# Patient Record
Sex: Female | Born: 2000 | Race: Black or African American | Hispanic: No | Marital: Single | Smoking: Former smoker
Health system: Southern US, Community
[De-identification: ages and names within clinical notes are randomized; demographics above are authoritative.]

## PROBLEM LIST (undated history)

## (undated) DIAGNOSIS — Z8619 Personal history of other infectious and parasitic diseases: Secondary | ICD-10-CM

## (undated) DIAGNOSIS — R7303 Prediabetes: Secondary | ICD-10-CM

## (undated) DIAGNOSIS — R87619 Unspecified abnormal cytological findings in specimens from cervix uteri: Secondary | ICD-10-CM

## (undated) DIAGNOSIS — N939 Abnormal uterine and vaginal bleeding, unspecified: Secondary | ICD-10-CM

## (undated) DIAGNOSIS — N946 Dysmenorrhea, unspecified: Secondary | ICD-10-CM

## (undated) DIAGNOSIS — A64 Unspecified sexually transmitted disease: Secondary | ICD-10-CM

## (undated) DIAGNOSIS — F32A Depression, unspecified: Secondary | ICD-10-CM

## (undated) HISTORY — DX: Unspecified sexually transmitted disease: A64

## (undated) HISTORY — DX: Personal history of other infectious and parasitic diseases: Z86.19

## (undated) HISTORY — DX: Abnormal uterine and vaginal bleeding, unspecified: N93.9

## (undated) HISTORY — DX: Prediabetes: R73.03

## (undated) HISTORY — DX: Unspecified abnormal cytological findings in specimens from cervix uteri: R87.619

## (undated) HISTORY — DX: Depression, unspecified: F32.A

## (undated) HISTORY — DX: Dysmenorrhea, unspecified: N94.6

---
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
## 2018-04-03 ENCOUNTER — Other Ambulatory Visit: Payer: Self-pay | Admitting: Obstetrics and Gynecology

## 2018-04-03 NOTE — Telephone Encounter (Signed)
Patient left voicemail over lunch requesting a refill request on Junel Fe to be sent to [facility] on [address].

## 2018-04-03 NOTE — Telephone Encounter (Signed)
Pharmacy updated.   Patient last seen in office on 09/26/17  Routing to covering provider to review refill request.

## 2018-04-03 NOTE — Telephone Encounter (Signed)
Medication refill request: Junel fe Last AEX: 05/17/17  Next AEX: noting scheduled at this time called patient left message to schedule aex.  Last MMG (if hormonal medication request): NA Refill authorized: #[id number] with 0 rf

## 2018-04-03 NOTE — Telephone Encounter (Signed)
Patient called in and scheduled her AEX for 06/25/18 at 10:15am with Dr. Oscar La.  Her [facility] is: [facility] [address]

## 2018-04-04 MED ORDER — NORETHIN ACE-ETH ESTRAD-FE 1-20 MG-MCG PO TABS: 1.0000 | ORAL_TABLET | Freq: Every day | ORAL | 0 refills | Status: DC

## 2018-04-04 NOTE — Telephone Encounter (Signed)
Call to patient to notify of RX, no answer.   Call to home number, spoke with patients mom, no info provided, advised to return call Noreene Larsson at [facility].

## 2018-06-25 ENCOUNTER — Ambulatory Visit: Payer: Federal, State, Local not specified - PPO | Admitting: Obstetrics and Gynecology

## 2018-09-18 ENCOUNTER — Telehealth: Payer: Self-pay | Admitting: Obstetrics and Gynecology

## 2018-09-18 NOTE — Telephone Encounter (Signed)
Patient has a lump on right breast.

## 2018-09-18 NOTE — Telephone Encounter (Signed)
Spoke with patient. She has AEX 10-06-18 with Dr.Jertson. She is complaining of Rt.Br.lump since 06/2018. It started small but last month got larger. This month has decreased in size some. Offered patient appointment for next week and she declined. States she is living in [place] now and will only be in town the week of her AEX. Advised patient not sure what else I can offer. I did advise her to seek evaluation at an Urgent care in [place] since this lump has been present since 06/2018. She thanked me for calling.  Routed to provider to sign and close.

## 2018-10-02 NOTE — Progress Notes (Signed)
18 y.o. G0P0000 Single Black or African American Not Hispanic or Latino female here for annual exam. She was started on OCP's last year, she stopped because it was difficult to get. Her cycles did get better on the pill, made the cramps tolerable. She ran out of pills in January, 20.  She isn't currently sexually active, last active 09/06/18. She wants to start depo provera. She treated herself for yeast on 08/26/18, she has had a brown thick vaginal discharge.   She c/o a lump in her right breast, noticed it in 3/20, initially tender not now. When she is on her cycle it grows, gets smaller on her cycle  Period Cycle (Days): 28 Period Duration (Days): 5-6 days Period Pattern: Regular Menstrual Flow: Light, Heavy Menstrual Control: Thin pad Menstrual Control Change Freq (Hours): changes pad every 2-3 hours on heavy days Dysmenorrhea: (!) Severe Dysmenorrhea Symptoms: Cramping  Patient's last menstrual period was 10/01/2018 (exact date).          Sexually active: No.  The current method of family planning is abstinence.    Exercising: No.  The patient does not participate in regular exercise at present. Smoker:  no  Health Maintenance: Pap:  N/A TDaP:  Up to date  Gardasil: no    reports that she has never smoked. She has never used smokeless tobacco. She reports that she does not drink alcohol or use drugs. Just finished her first year of college, will study social work.   Past Medical History:  Diagnosis Date  . Abnormal uterine bleeding   . Dysmenorrhea   . History of chlamydia   . Prediabetes     History reviewed. No pertinent surgical history.  No current outpatient medications on file.   No current facility-administered medications for this visit.     Family History  Problem Relation Age of Onset  . Hypertension Mother   . Diabetes Mother     Review of Systems  Constitutional:       Right breast mass  HENT: Negative.   Eyes: Negative.   Respiratory: Negative.    Cardiovascular: Negative.   Gastrointestinal: Negative.   Endocrine: Negative.   Genitourinary: Positive for menstrual problem.  Musculoskeletal: Negative.   Skin: Negative.   Allergic/Immunologic: Negative.   Neurological: Negative.   Hematological: Negative.   Psychiatric/Behavioral: Negative.     Exam:   BP 112/68 (BP Location: Right Arm, Patient Position: Sitting, Cuff Size: Normal)   Pulse 60   Temp (!) 97.3 F (36.3 C) (Skin)   Ht 5\' 4"  (1.626 m)   Wt 116 lb (52.6 kg)   LMP 10/01/2018 (Exact Date)   BMI 19.91 kg/m   Weight change: @WEIGHTCHANGE @ Height:   Height: 5\' 4"  (162.6 cm)  Ht Readings from Last 3 Encounters:  10/06/18 5\' 4"  (1.626 m) (45 %, Z= -0.12)*  09/09/17 5\' 6"  (1.676 m) (75 %, Z= 0.69)*   * Growth percentiles are based on CDC (Girls, 2-20 Years) data.    General appearance: alert, cooperative and appears stated age Head: Normocephalic, without obvious abnormality, atraumatic Neck: no adenopathy, supple, symmetrical, trachea midline and thyroid normal to inspection and palpation Lungs: clear to auscultation bilaterally Cardiovascular: regular rate and rhythm Breasts: in the right breast at 2 o'clock she has a 3+ x 1.5 cm, smooth, mobile lump. No other lumps, no skin changes.  Abdomen: soft, non-tender; non distended,  no masses,  no organomegaly Extremities: extremities normal, atraumatic, no cyanosis or edema Skin: Skin color, texture, turgor normal.  No rashes or lesions Lymph nodes: Cervical, supraclavicular, and axillary nodes normal. No abnormal inguinal nodes palpated Neurologic: Grossly normal   Pelvic: External genitalia:  no lesions              Urethra:  normal appearing urethra with no masses, tenderness or lesions              Bartholins and Skenes: normal                 Vagina: normal appearing vagina with normal color a slight increase in thick, white vaginal discharge              Cervix: no lesions               Bimanual Exam:   Uterus:  normal size, contour, position, consistency, mobility, non-tender              Adnexa: no mass, fullness, tenderness               Rectovaginal: Confirms               Anus:  normal sphincter tone, no lesions  Chaperone was present for exam.  A:  Well Woman with normal exam  Right breast lump, 2 o'clock  Contraception counseling  Vaginal discharge  Intermenstrual spotting      P:   No pap until 19  STD testing (wants full testing, including HSV)  Will set up right breast ultrasound  Affirm  Discussed breast self exam  Discussed calcium and vit D intake  UPT negative  Return on her cycle for depo provera 150 mg IM q 3 months x 1 year

## 2018-10-06 ENCOUNTER — Telehealth: Payer: Self-pay | Admitting: *Deleted

## 2018-10-06 ENCOUNTER — Ambulatory Visit (INDEPENDENT_AMBULATORY_CARE_PROVIDER_SITE_OTHER): Payer: Federal, State, Local not specified - PPO | Admitting: Obstetrics and Gynecology

## 2018-10-06 ENCOUNTER — Encounter: Payer: Self-pay | Admitting: Obstetrics and Gynecology

## 2018-10-06 ENCOUNTER — Other Ambulatory Visit: Payer: Self-pay

## 2018-10-06 VITALS — BP 112/68 | HR 60 | Temp 97.3°F | Ht 64.0 in | Wt 116.0 lb

## 2018-10-06 DIAGNOSIS — N6312 Unspecified lump in the right breast, upper inner quadrant: Secondary | ICD-10-CM | POA: Diagnosis not present

## 2018-10-06 DIAGNOSIS — Z8619 Personal history of other infectious and parasitic diseases: Secondary | ICD-10-CM | POA: Insufficient documentation

## 2018-10-06 DIAGNOSIS — Z113 Encounter for screening for infections with a predominantly sexual mode of transmission: Secondary | ICD-10-CM | POA: Diagnosis not present

## 2018-10-06 DIAGNOSIS — R7303 Prediabetes: Secondary | ICD-10-CM

## 2018-10-06 DIAGNOSIS — Z0000 Encounter for general adult medical examination without abnormal findings: Secondary | ICD-10-CM | POA: Diagnosis not present

## 2018-10-06 DIAGNOSIS — N898 Other specified noninflammatory disorders of vagina: Secondary | ICD-10-CM

## 2018-10-06 DIAGNOSIS — Z3009 Encounter for other general counseling and advice on contraception: Secondary | ICD-10-CM

## 2018-10-06 DIAGNOSIS — Z01419 Encounter for gynecological examination (general) (routine) without abnormal findings: Secondary | ICD-10-CM

## 2018-10-06 DIAGNOSIS — N923 Ovulation bleeding: Secondary | ICD-10-CM | POA: Diagnosis not present

## 2018-10-06 LAB — POCT URINE PREGNANCY: Preg Test, Ur: NEGATIVE

## 2018-10-06 NOTE — Patient Instructions (Signed)
EXERCISE AND DIET:  We recommended that you start or continue a regular exercise program for good health. Regular exercise means any activity that makes your heart beat faster and makes you sweat.  We recommend exercising at least 30 minutes per day at least 3 days a week, preferably 4 or 5.  We also recommend a diet low in fat and sugar.  Inactivity, poor dietary choices and obesity can cause diabetes, heart attack, stroke, and kidney damage, among others.    ALCOHOL AND SMOKING:  Women should limit their alcohol intake to no more than 7 drinks/beers/glasses of wine (combined, not each!) per week. Moderation of alcohol intake to this level decreases your risk of breast cancer and liver damage. And of course, no recreational drugs are part of a healthy lifestyle.  And absolutely no smoking or even second hand smoke. Most people know smoking can cause heart and lung diseases, but did you know it also contributes to weakening of your bones? Aging of your skin?  Yellowing of your teeth and nails?  CALCIUM AND VITAMIN D:  Adequate intake of calcium and Vitamin D are recommended.  The recommendations for exact amounts of these supplements seem to change often, but generally speaking 1,000 mg of calcium (between diet and supplement) and 800 units of Vitamin D per day seems prudent. Certain women may benefit from higher intake of Vitamin D.  If you are among these women, your doctor will have told you during your visit.    PAP SMEARS:  Pap smears, to check for cervical cancer or precancers,  have traditionally been done yearly, although recent scientific advances have shown that most women can have pap smears less often.  However, every woman still should have a physical exam from her gynecologist every year. It will include a breast check, inspection of the vulva and vagina to check for abnormal growths or skin changes, a visual exam of the cervix, and then an exam to evaluate the size and shape of the uterus and  ovaries.  And after 21 years of age, a rectal exam is indicated to check for rectal cancers. We will also provide age appropriate advice regarding health maintenance, like when you should have certain vaccines, screening for sexually transmitted diseases, bone density testing, colonoscopy, mammograms, etc.   MAMMOGRAMS:  All women over 75 years old should have a yearly mammogram. Many facilities now offer a "3D" mammogram, which may cost around $50 extra out of pocket. If possible,  we recommend you accept the option to have the 3D mammogram performed.  It both reduces the number of women who will be called back for extra views which then turn out to be normal, and it is better than the routine mammogram at detecting truly abnormal areas.    COLON CANCER SCREENING: Now recommend starting at age 57. At this time colonoscopy is not covered for routine screening until 50. There are take home tests that can be done between 45-49.   COLONOSCOPY:  Colonoscopy to screen for colon cancer is recommended for all women at age 91.  We know, you hate the idea of the prep.  We agree, BUT, having colon cancer and not knowing it is worse!!  Colon cancer so often starts as a polyp that can be seen and removed at colonscopy, which can quite literally save your life!  And if your first colonoscopy is normal and you have no family history of colon cancer, most women don't have to have it again for  10 years.  Once every ten years, you can do something that may end up saving your life, right?  We will be happy to help you get it scheduled when you are ready.  Be sure to check your insurance coverage so you understand how much it will cost.  It may be covered as a preventative service at no cost, but you should check your particular policy.   ° ° ° °Breast Self-Awareness °Breast self-awareness means being familiar with how your breasts look and feel. It involves checking your breasts regularly and reporting any changes to your  health care provider. °Practicing breast self-awareness is important. A change in your breasts can be a sign of a serious medical problem. Being familiar with how your breasts look and feel allows you to find any problems early, when treatment is more likely to be successful. All women should practice breast self-awareness, including women who have had breast implants. °How to do a breast self-exam °One way to learn what is normal for your breasts and whether your breasts are changing is to do a breast self-exam. To do a breast self-exam: °Look for Changes ° °1. Remove all the clothing above your waist. °2. Stand in front of a mirror in a room with good lighting. °3. Put your hands on your hips. °4. Push your hands firmly downward. °5. Compare your breasts in the mirror. Look for differences between them (asymmetry), such as: °? Differences in shape. °? Differences in size. °? Puckers, dips, and bumps in one breast and not the other. °6. Look at each breast for changes in your skin, such as: °? Redness. °? Scaly areas. °7. Look for changes in your nipples, such as: °? Discharge. °? Bleeding. °? Dimpling. °? Redness. °? A change in position. °Feel for Changes °Carefully feel your breasts for lumps and changes. It is best to do this while lying on your back on the floor and again while sitting or standing in the shower or tub with soapy water on your skin. Feel each breast in the following way: °· Place the arm on the side of the breast you are examining above your head. °· Feel your breast with the other hand. °· Start in the nipple area and make ¾ inch (2 cm) overlapping circles to feel your breast. Use the pads of your three middle fingers to do this. Apply light pressure, then medium pressure, then firm pressure. The light pressure will allow you to feel the tissue closest to the skin. The medium pressure will allow you to feel the tissue that is a little deeper. The firm pressure will allow you to feel the tissue  close to the ribs. °· Continue the overlapping circles, moving downward over the breast until you feel your ribs below your breast. °· Move one finger-width toward the center of the body. Continue to use the ¾ inch (2 cm) overlapping circles to feel your breast as you move slowly up toward your collarbone. °· Continue the up and down exam using all three pressures until you reach your armpit. ° °Write Down What You Find ° °Write down what is normal for each breast and any changes that you find. Keep a written record with breast changes or normal findings for each breast. By writing this information down, you do not need to depend only on memory for size, tenderness, or location. Write down where you are in your menstrual cycle, if you are still menstruating. °If you are having trouble noticing differences   in your breasts, do not get discouraged. With time you will become more familiar with the variations in your breasts and more comfortable with the exam. °How often should I examine my breasts? °Examine your breasts every month. If you are breastfeeding, the best time to examine your breasts is after a feeding or after using a breast pump. If you menstruate, the best time to examine your breasts is 5-7 days after your period is over. During your period, your breasts are lumpier, and it may be more difficult to notice changes. °When should I see my health care provider? °See your health care provider if you notice: °· A change in shape or size of your breasts or nipples. °· A change in the skin of your breast or nipples, such as a reddened or scaly area. °· Unusual discharge from your nipples. °· A lump or thick area that was not there before. °· Pain in your breasts. °· Anything that concerns you. ° °Medroxyprogesterone injection [Contraceptive] °What is this medicine? °MEDROXYPROGESTERONE (me DROX ee proe JES te rone) contraceptive injections prevent pregnancy. They provide effective birth control for 3 months.  Depo-subQ Provera 104 is also used for treating pain related to endometriosis. °This medicine may be used for other purposes; ask your health care provider or pharmacist if you have questions. °COMMON BRAND NAME(S): Depo-Provera, Depo-subQ Provera 104 °What should I tell my health care provider before I take this medicine? °They need to know if you have any of these conditions: °· frequently drink alcohol °· asthma °· blood vessel disease or a history of a blood clot in the lungs or legs °· bone disease such as osteoporosis °· breast cancer °· diabetes °· eating disorder (anorexia nervosa or bulimia) °· high blood pressure °· HIV infection or AIDS °· kidney disease °· liver disease °· mental depression °· migraine °· seizures (convulsions) °· stroke °· tobacco smoker °· vaginal bleeding °· an unusual or allergic reaction to medroxyprogesterone, other hormones, medicines, foods, dyes, or preservatives °· pregnant or trying to get pregnant °· breast-feeding °How should I use this medicine? °Depo-Provera Contraceptive injection is given into a muscle. Depo-subQ Provera 104 injection is given under the skin. These injections are given by a health care professional. You must not be pregnant before getting an injection. The injection is usually given during the first 5 days after the start of a menstrual period or 6 weeks after delivery of a baby. °Talk to your pediatrician regarding the use of this medicine in children. Special care may be needed. These injections have been used in female children who have started having menstrual periods. °Overdosage: If you think you have taken too much of this medicine contact a poison control center or emergency room at once. °NOTE: This medicine is only for you. Do not share this medicine with others. °What if I miss a dose? °Try not to miss a dose. You must get an injection once every 3 months to maintain birth control. If you cannot keep an appointment, call and reschedule it. If  you wait longer than 13 weeks between Depo-Provera contraceptive injections or longer than 14 weeks between Depo-subQ Provera 104 injections, you could get pregnant. Use another method for birth control if you miss your appointment. You may also need a pregnancy test before receiving another injection. °What may interact with this medicine? °Do not take this medicine with any of the following medications: °· bosentan °This medicine may also interact with the following medications: °· aminoglutethimide °· antibiotics   medicines for infections, especially rifampin, rifabutin, rifapentine, and griseofulvin  aprepitant  barbiturate medicines such as phenobarbital or primidone  bexarotene  carbamazepine  medicines for seizures like ethotoin, felbamate, oxcarbazepine, phenytoin, topiramate  modafinil  St. John's wort This list may not describe all possible interactions. Give your health care provider a list of all the medicines, herbs, non-prescription drugs, or dietary supplements you use. Also tell them if you smoke, drink alcohol, or use illegal drugs. Some items may interact with your medicine. What should I watch for while using this medicine? This drug does not protect you against HIV infection (AIDS) or other sexually transmitted diseases. Use of this product may cause you to lose calcium from your bones. Loss of calcium may cause weak bones (osteoporosis). Only use this product for more than 2 years if other forms of birth control are not right for you. The longer you use this product for birth control the more likely you will be at risk for weak bones. Ask your health care professional how you can keep strong bones. You may have a change in bleeding pattern or irregular periods. Many females stop having periods while taking this drug. If you have received your injections on time, your chance of being pregnant is very low. If you think you may be pregnant, see your health care professional as  soon as possible. Tell your health care professional if you want to get pregnant within the next year. The effect of this medicine may last a long time after you get your last injection. What side effects may I notice from receiving this medicine? Side effects that you should report to your doctor or health care professional as soon as possible:  allergic reactions like skin rash, itching or hives, swelling of the face, lips, or tongue  breast tenderness or discharge  breathing problems  changes in vision  depression  feeling faint or lightheaded, falls  fever  pain in the abdomen, chest, groin, or leg  problems with balance, talking, walking  unusually weak or tired  yellowing of the eyes or skin Side effects that usually do not require medical attention (report to your doctor or health care professional if they continue or are bothersome):  acne  fluid retention and swelling  headache  irregular periods, spotting, or absent periods  temporary pain, itching, or skin reaction at site where injected  weight gain This list may not describe all possible side effects. Call your doctor for medical advice about side effects. You may report side effects to FDA at 1-800-FDA-1088. Where should I keep my medicine? This does not apply. The injection will be given to you by a health care professional. NOTE: This sheet is a summary. It may not cover all possible information. If you have questions about this medicine, talk to your doctor, pharmacist, or health care provider.  2020 Elsevier/Gold Standard (2008-03-26 18:37:56)

## 2018-10-06 NOTE — Telephone Encounter (Signed)
Spoke [place] at [facility]. Right breast US scheduled for 10/07/18 at 10:10 am, arrive at 9:50am.

## 2018-10-06 NOTE — Telephone Encounter (Signed)
-----   Message from Salvadore Dom, MD sent at 10/06/2018 11:06 AM EDT ----- Please set this patient up for a right breast ultrasound. Marisa Snyder has a lump at 2 o'clock.  Thanks, Sharee Pimple

## 2018-10-06 NOTE — Telephone Encounter (Signed)
Spoke with patient, advised of appt at [facility], provided contact information. Patient verbalizes understanding and is agreeable to date and time.   Routing to provider for final review. Patient is agreeable to disposition. Will close encounter.

## 2018-10-07 ENCOUNTER — Ambulatory Visit
Admission: RE | Admit: 2018-10-07 | Discharge: 2018-10-07 | Disposition: A | Discharge status: Home / Self Care | Payer: Federal, State, Local not specified - PPO | Source: Ambulatory Visit | Attending: Obstetrics and Gynecology | Admitting: Obstetrics and Gynecology

## 2018-10-07 ENCOUNTER — Other Ambulatory Visit: Payer: Self-pay | Admitting: Obstetrics and Gynecology

## 2018-10-07 DIAGNOSIS — N6312 Unspecified lump in the right breast, upper inner quadrant: Secondary | ICD-10-CM

## 2018-10-07 LAB — VAGINITIS/VAGINOSIS, DNA PROBE
Candida Species: NEGATIVE
Gardnerella vaginalis: NEGATIVE
Trichomonas vaginosis: NEGATIVE

## 2018-10-08 LAB — COMPREHENSIVE METABOLIC PANEL
ALT: 12 IU/L (ref 0–32)
AST: 21 IU/L (ref 0–40)
Albumin/Globulin Ratio: 1.4 (ref 1.2–2.2)
Albumin: 4.2 g/dL (ref 3.9–5.0)
Alkaline Phosphatase: 47 IU/L (ref 39–117)
BUN/Creatinine Ratio: 13 (ref 9–23)
BUN: 9 mg/dL (ref 6–20)
Bilirubin Total: 0.3 mg/dL (ref 0.0–1.2)
CO2: 21 mmol/L (ref 20–29)
Calcium: 9.4 mg/dL (ref 8.7–10.2)
Chloride: 101 mmol/L (ref 96–106)
Creatinine, Ser: 0.71 mg/dL (ref 0.57–1.00)
GFR calc Af Amer: 143 mL/min/{1.73_m2} (ref >59)
GFR calc non Af Amer: 124 mL/min/{1.73_m2} (ref >59)
Globulin, Total: 3 g/dL (ref 1.5–4.5)
Glucose: 79 mg/dL (ref 65–99)
Potassium: 3.9 mmol/L (ref 3.5–5.2)
Sodium: 138 mmol/L (ref 134–144)
Total Protein: 7.2 g/dL (ref 6.0–8.5)

## 2018-10-08 LAB — LIPID PANEL
Chol/HDL Ratio: 2.1 ratio (ref 0.0–4.4)
Cholesterol, Total: 167 mg/dL (ref 100–169)
HDL: 78 mg/dL (ref >39)
LDL Calculated: 82 mg/dL (ref 0–109)
Triglycerides: 33 mg/dL (ref 0–89)
VLDL Cholesterol Cal: 7 mg/dL (ref 5–40)

## 2018-10-08 LAB — CBC
Hematocrit: 37.1 % (ref 34.0–46.6)
Hemoglobin: 11.2 g/dL (ref 11.1–15.9)
MCH: 25.2 pg — ABNORMAL LOW (ref 26.6–33.0)
MCHC: 30.2 g/dL — ABNORMAL LOW (ref 31.5–35.7)
MCV: 83 fL (ref 79–97)
Platelets: 326 10*3/uL (ref 150–450)
RBC: 4.45 x10E6/uL (ref 3.77–5.28)
RDW: 14.1 % (ref 11.7–15.4)
WBC: 5.7 10*3/uL (ref 3.4–10.8)

## 2018-10-08 LAB — HEP, RPR, HIV PANEL
HIV Screen 4th Generation wRfx: NONREACTIVE
Hepatitis B Surface Ag: NEGATIVE
RPR Ser Ql: NONREACTIVE

## 2018-10-08 LAB — HEPATITIS C ANTIBODY: Hep C Virus Ab: 0.2 s/co ratio (ref 0.0–0.9)

## 2018-10-08 LAB — HEMOGLOBIN A1C
Est. average glucose Bld gHb Est-mCnc: 117 mg/dL
Hgb A1c MFr Bld: 5.7 % — ABNORMAL HIGH (ref 4.8–5.6)

## 2018-10-08 LAB — HSV 2 ANTIBODY, IGG: HSV 2 IgG, Type Spec: <0.91 index (ref 0.00–0.90)

## 2018-10-09 LAB — GC/CHLAMYDIA PROBE AMP
Chlamydia trachomatis, NAA: NEGATIVE
Neisseria Gonorrhoeae by PCR: NEGATIVE

## 2018-10-24 ENCOUNTER — Telehealth: Payer: Self-pay | Admitting: Obstetrics and Gynecology

## 2018-10-24 NOTE — Telephone Encounter (Signed)
Spoke with patient. LMP 10/22/18. Nurse visit scheduled for 8/10 at 9:45am. Covid19 prescreen negative, precautions reviewed.   Routing to provider for final review. Patient is agreeable to disposition. Will close encounter.

## 2018-10-24 NOTE — Telephone Encounter (Signed)
Patient returning call.

## 2018-10-24 NOTE — Telephone Encounter (Signed)
Patient is calling regarding starting her cycle on Wednesday (10/22/2018). Patient was told to call when she started her cycle to set up an appointment to start Depo.

## 2018-10-24 NOTE — Telephone Encounter (Signed)
Call returned to patient, no answer, voicemail not set up.    Reviewed 10/06/18 AEX notes per Dr. Talbert Nan: Return on her cycle for depo provera 150 mg IM q 3 months x 1 year  Ok to proceed with scheduling days 1-7 of cycle.

## 2018-10-27 ENCOUNTER — Ambulatory Visit (INDEPENDENT_AMBULATORY_CARE_PROVIDER_SITE_OTHER): Payer: Federal, State, Local not specified - PPO

## 2018-10-27 ENCOUNTER — Other Ambulatory Visit: Payer: Self-pay

## 2018-10-27 VITALS — BP 108/70 | HR 72 | Temp 97.8°F | Resp 14 | Ht 64.0 in | Wt 114.0 lb

## 2018-10-27 DIAGNOSIS — Z3042 Encounter for surveillance of injectable contraceptive: Secondary | ICD-10-CM

## 2018-10-27 MED ORDER — MEDROXYPROGESTERONE ACETATE 150 MG/ML IM SUSP
150.0000 mg | Freq: Once | INTRAMUSCULAR | Status: AC
  Administered 2018-10-27: 11:00:00 150 mg via INTRAMUSCULAR

## 2018-10-27 NOTE — Progress Notes (Signed)
Patient is here for Depo Provera Injection Patient is within Depo Provera Calender Limits first depo Next Depo Due between: 10/26-11/9 Last AEX: 10/06/18 AEX Scheduled: 10/08/19  Patient is aware when next depo is due  Pt tolerated Injection well in [place].  Routed to provider for review, encounter closed.

## 2018-11-10 ENCOUNTER — Telehealth: Payer: Self-pay | Admitting: Obstetrics and Gynecology

## 2018-11-10 NOTE — Telephone Encounter (Signed)
Call to patient. Patient states she has a yellowish-green discharge that started a couple of days ago. Patient states it has a fishy/sour smell to it. Denies fever or pelvic pain. States she is not currently sexually active and is currently on depo provera. LMP was 10-22-2018. OV recommended. Patient scheduled for 11-11-2018 at 1415. Patient agreeable to date and time of appointment.   Routing to provider and will close encounter.

## 2018-11-10 NOTE — Telephone Encounter (Signed)
Appointment Request From: Marisa Snyder    With Provider: Salvadore Dom, MD [facility]]    Preferred Date Range: Any    Preferred Times: Monday Morning, Tuesday Morning, Wednesday Morning, Thursday Morning, Friday Morning    Reason for visit: Request an Appointment    Comments:  I have a yellow-green discharge that has an odor to it coming from vaginal area.

## 2018-11-10 NOTE — Progress Notes (Deleted)
GYNECOLOGY  VISIT   HPI: 18 y.o.   Single Black or African American Not Hispanic or Latino  female   G0P0000 with Patient's last menstrual period was 10/22/2018.   here for     GYNECOLOGIC HISTORY: Patient's last menstrual period was 10/22/2018. Contraception:*** Menopausal hormone therapy: ***        OB History    Gravida  0   Para  0   Term  0   Preterm  0   AB  0   Living  0     SAB  0   TAB  0   Ectopic  0   Multiple  0   Live Births  0              Patient Active Problem List   Diagnosis Date Noted  . History of chlamydia   . Prediabetes     Past Medical History:  Diagnosis Date  . Abnormal uterine bleeding   . Dysmenorrhea   . History of chlamydia   . Prediabetes     No past surgical history on file.  No current outpatient medications on file.   No current facility-administered medications for this visit.      ALLERGIES: Patient has no known allergies.  Family History  Problem Relation Age of Onset  . Hypertension Mother   . Diabetes Mother     Social History   Socioeconomic History  . Marital status: Single    Spouse name: Not on file  . Number of children: Not on file  . Years of education: Not on file  . Highest education level: Not on file  Occupational History  . Not on file  Social Needs  . Financial resource strain: Not on file  . Food insecurity    Worry: Not on file    Inability: Not on file  . Transportation needs    Medical: Not on file    Non-medical: Not on file  Tobacco Use  . Smoking status: Never Smoker  . Smokeless tobacco: Never Used  Substance and Sexual Activity  . Alcohol use: No    Frequency: Never  . Drug use: No  . Sexual activity: Not Currently    Partners: Male  Lifestyle  . Physical activity    Days per week: Not on file    Minutes per session: Not on file  . Stress: Not on file  Relationships  . Social Musicianconnections    Talks on phone: Not on file    Gets together: Not on file   Attends religious service: Not on file    Active member of club or organization: Not on file    Attends meetings of clubs or organizations: Not on file    Relationship status: Not on file  . Intimate partner violence    Fear of current or ex partner: Not on file    Emotionally abused: Not on file    Physically abused: Not on file    Forced sexual activity: Not on file  Other Topics Concern  . Not on file  Social History Narrative  . Not on file    ROS  PHYSICAL EXAMINATION:    LMP 10/22/2018     General appearance: alert, cooperative and appears stated age Neck: no adenopathy, supple, symmetrical, trachea midline and thyroid {CHL AMB PHY EX THYROID NORM [phone]::"normal to inspection and palpation"} Breasts: {Exam; breast:13139::"normal appearance, no masses or tenderness"} Abdomen: soft, non-tender; non distended, no masses,  no organomegaly  Pelvic:  External genitalia:  no lesions              Urethra:  normal appearing urethra with no masses, tenderness or lesions              Bartholins and Skenes: normal                 Vagina: normal appearing vagina with normal color and discharge, no lesions              Cervix: {CHL AMB PHY EX CERVIX NORM DEFAULT:[phone]::"no lesions"}              Bimanual Exam:  Uterus:  {CHL AMB PHY EX UTERUS NORM DEFAULT:[phone]::"normal size, contour, position, consistency, mobility, non-tender"}              Adnexa: {CHL AMB PHY EX ADNEXA NO MASS DEFAULT:[phone]::"no mass, fullness, tenderness"}              Rectovaginal: {yes no:314532}.  Confirms.              Anus:  normal sphincter tone, no lesions  Chaperone was present for exam.  ASSESSMENT     PLAN    An After Visit Summary was printed and given to the patient.  *** minutes face to face time of which over 50% was spent in counseling.

## 2018-11-11 ENCOUNTER — Telehealth: Payer: Self-pay | Admitting: Obstetrics and Gynecology

## 2018-11-11 ENCOUNTER — Ambulatory Visit: Payer: Federal, State, Local not specified - PPO | Admitting: Obstetrics and Gynecology

## 2018-11-11 NOTE — Telephone Encounter (Signed)
Spoke with patient. Requesting to reschedule OV for vaginal odor and d/c. OV rescheduled to 8/27 at 1:30pm with Dr. Talbert Nan.   Routing to provider for final review. Patient is agreeable to disposition. Will close encounter.

## 2018-11-11 NOTE — Telephone Encounter (Signed)
Patient would like to reschedule her appointment originally scheduled for today 11/11/18. Nothing available for the next few days.

## 2018-11-13 ENCOUNTER — Encounter: Payer: Self-pay | Admitting: Obstetrics and Gynecology

## 2018-11-13 ENCOUNTER — Other Ambulatory Visit: Payer: Self-pay

## 2018-11-13 ENCOUNTER — Ambulatory Visit (INDEPENDENT_AMBULATORY_CARE_PROVIDER_SITE_OTHER): Payer: Federal, State, Local not specified - PPO | Admitting: Obstetrics and Gynecology

## 2018-11-13 VITALS — BP 100/58 | HR 84 | Temp 97.2°F | Resp 12 | Wt 116.0 lb

## 2018-11-13 DIAGNOSIS — N760 Acute vaginitis: Secondary | ICD-10-CM | POA: Diagnosis not present

## 2018-11-13 DIAGNOSIS — N898 Other specified noninflammatory disorders of vagina: Secondary | ICD-10-CM

## 2018-11-13 MED ORDER — METRONIDAZOLE 500 MG PO TABS: 500.0000 mg | ORAL_TABLET | Freq: Two times a day (BID) | ORAL | 0 refills | Status: DC

## 2018-11-13 NOTE — Patient Instructions (Signed)
Vaginitis Vaginitis is a condition in which the vaginal tissue swells and becomes red (inflamed). This condition is most often caused by a change in the normal balance of bacteria and yeast that live in the vagina. This change causes an overgrowth of certain bacteria or yeast, which causes the inflammation. There are different types of vaginitis, but the most common types are:  Bacterial vaginosis.  Yeast infection (candidiasis).  Trichomoniasis vaginitis. This is a sexually transmitted disease (STD).  Viral vaginitis.  Atrophic vaginitis.  Allergic vaginitis. What are the causes? The cause of this condition depends on the type of vaginitis. It can be caused by:  Bacteria (bacterial vaginosis).  Yeast, which is a fungus (yeast infection).  A parasite (trichomoniasis vaginitis).  A virus (viral vaginitis).  Low hormone levels (atrophic vaginitis). Low hormone levels can occur during pregnancy, breastfeeding, or after menopause.  Irritants, such as bubble baths, scented tampons, and feminine sprays (allergic vaginitis). Other factors can change the normal balance of the yeast and bacteria that live in the vagina. These include:  Antibiotic medicines.  Poor hygiene.  Diaphragms, vaginal sponges, spermicides, birth control pills, and intrauterine devices (IUD).  Sex.  Infection.  Uncontrolled diabetes.  A weakened defense (immune) system. What increases the risk? This condition is more likely to develop in women who:  Smoke.  Use vaginal douches, scented tampons, or scented sanitary pads.  Wear tight-fitting pants.  Wear thong underwear.  Use oral birth control pills or an IUD.  Have sex without a condom.  Have multiple sex partners.  Have an STD.  Frequently use the spermicide nonoxynol-9.  Eat lots of foods high in sugar.  Have uncontrolled diabetes.  Have low estrogen levels.  Have a weakened immune system from an immune disorder or medical  treatment.  Are pregnant or breastfeeding. What are the signs or symptoms? Symptoms vary depending on the cause of the vaginitis. Common symptoms include:  Abnormal vaginal discharge. ? The discharge is white, gray, or yellow with bacterial vaginosis. ? The discharge is thick, white, and cheesy with a yeast infection. ? The discharge is frothy and yellow or greenish with trichomoniasis.  A bad vaginal smell. The smell is fishy with bacterial vaginosis.  Vaginal itching, pain, or swelling.  Sex that is painful.  Pain or burning when urinating. Sometimes there are no symptoms. How is this diagnosed? This condition is diagnosed based on your symptoms and medical history. A physical exam, including a pelvic exam, will also be done. You may also have other tests, including:  Tests to determine the pH level (acidity or alkalinity) of your vagina.  A whiff test, to assess the odor that results when a sample of your vaginal discharge is mixed with a potassium hydroxide solution.  Tests of vaginal fluid. A sample will be examined under a microscope. How is this treated? Treatment varies depending on the type of vaginitis you have. Your treatment may include:  Antibiotic creams or pills to treat bacterial vaginosis and trichomoniasis.  Antifungal medicines, such as vaginal creams or suppositories, to treat a yeast infection.  Medicine to ease discomfort if you have viral vaginitis. Your sexual partner should also be treated.  Estrogen delivered in a cream, pill, suppository, or vaginal ring to treat atrophic vaginitis. If vaginal dryness occurs, lubricants and moisturizing creams may help. You may need to avoid scented soaps, sprays, or douches.  Stopping use of a product that is causing allergic vaginitis. Then using a vaginal cream to treat the symptoms. Follow   these instructions at home: Lifestyle  Keep your genital area clean and dry. Avoid soap, and only rinse the area with  water.  Do not douche or use tampons until your health care provider says it is okay to do so. Use sanitary pads, if needed.  Do not have sex until your health care provider approves. When you can return to sex, practice safe sex and use condoms.  Wipe from front to back. This avoids the spread of bacteria from the rectum to the vagina. General instructions  Take over-the-counter and prescription medicines only as told by your health care provider.  If you were prescribed an antibiotic medicine, take or use it as told by your health care provider. Do not stop taking or using the antibiotic even if you start to feel better.  Keep all follow-up visits as told by your health care provider. This is important. How is this prevented?  Use mild, non-scented products. Do not use things that can irritate the vagina, such as fabric softeners. Avoid the following products if they are scented: ? Feminine sprays. ? Detergents. ? Tampons. ? Feminine hygiene products. ? Soaps or bubble baths.  Let air reach your genital area. ? Wear cotton underwear to reduce moisture buildup. ? Avoid wearing underwear while you sleep. ? Avoid wearing tight pants and underwear or nylons without a cotton panel. ? Avoid wearing thong underwear.  Take off any wet clothing, such as bathing suits, as soon as possible.  Practice safe sex and use condoms. Contact a health care provider if:  You have abdominal pain.  You have a fever.  You have symptoms that last for more than 2-3 days. Get help right away if:  You have a fever and your symptoms suddenly get worse. Summary  Vaginitis is a condition in which the vaginal tissue becomes inflamed.This condition is most often caused by a change in the normal balance of bacteria and yeast that live in the vagina.  Treatment varies depending on the type of vaginitis you have.  Do not douche, use tampons , or have sex until your health care provider approves. When  you can return to sex, practice safe sex and use condoms. This information is not intended to replace advice given to you by your health care provider. Make sure you discuss any questions you have with your health care provider. Document Released: 12/31/2006 Document Revised: 02/15/2017 Document Reviewed: 04/10/2016 Elsevier Patient Education  2020 [employer] Inc[employer]  

## 2018-11-13 NOTE — Progress Notes (Signed)
GYNECOLOGY  VISIT   HPI: 18 y.o.   Single Black or African American Not Hispanic or Latino  female   G0P0000 with Patient's last menstrual period was 10/22/2018.   here for vaginal odor and discharge. Per patient after she wore new underwear she noticed yellow discharge with odor and itching. Last sexually active 10/28/18 per patient. The d/c started last week, it's thin, getting heavier, + odor, some mild itching.  No new sexual partner. Recent negative STD testing, no pelvic pain. Using condoms  GYNECOLOGIC HISTORY: Patient's last menstrual period was 10/22/2018. Contraception:Depo Provera Menopausal hormone therapy: n/a        OB History    Gravida  0   Para  0   Term  0   Preterm  0   AB  0   Living  0     SAB  0   TAB  0   Ectopic  0   Multiple  0   Live Births  0              Patient Active Problem List   Diagnosis Date Noted  . History of chlamydia   . Prediabetes     Past Medical History:  Diagnosis Date  . Abnormal uterine bleeding   . Dysmenorrhea   . History of chlamydia   . Prediabetes     History reviewed. No pertinent surgical history.  Current Outpatient Medications  Medication Sig Dispense Refill  . medroxyPROGESTERone (DEPO-PROVERA) 150 MG/ML injection Inject 150 mg into the muscle every 3 (three) months.     No current facility-administered medications for this visit.      ALLERGIES: Patient has no known allergies.  Family History  Problem Relation Age of Onset  . Hypertension Mother   . Diabetes Mother     Social History   Socioeconomic History  . Marital status: Single    Spouse name: Not on file  . Number of children: Not on file  . Years of education: Not on file  . Highest education level: Not on file  Occupational History  . Not on file  Social Needs  . Financial resource strain: Not on file  . Food insecurity    Worry: Not on file    Inability: Not on file  . Transportation needs    Medical: Not on file     Non-medical: Not on file  Tobacco Use  . Smoking status: Never Smoker  . Smokeless tobacco: Never Used  Substance and Sexual Activity  . Alcohol use: No    Frequency: Never  . Drug use: No  . Sexual activity: Yes    Partners: Male  Lifestyle  . Physical activity    Days per week: Not on file    Minutes per session: Not on file  . Stress: Not on file  Relationships  . Social Herbalist on phone: Not on file    Gets together: Not on file    Attends religious service: Not on file    Active member of club or organization: Not on file    Attends meetings of clubs or organizations: Not on file    Relationship status: Not on file  . Intimate partner violence    Fear of current or ex partner: Not on file    Emotionally abused: Not on file    Physically abused: Not on file    Forced sexual activity: Not on file  Other Topics Concern  . Not on file  Social History Narrative  . Not on file    Review of Systems  Constitutional: Negative.   HENT: Negative.   Eyes: Negative.   Respiratory: Negative.   Cardiovascular: Negative.   Gastrointestinal: Negative.   Genitourinary:       Vaginal itching Vaginal Odor Vaginal discharge  Musculoskeletal: Negative.   Skin: Negative.   Neurological: Negative.   Endo/Heme/Allergies: Negative.   Psychiatric/Behavioral: Negative.     PHYSICAL EXAMINATION:    BP (!) 100/58 (BP Location: Right Arm, Patient Position: Sitting, Cuff Size: Normal)   Pulse 84   Temp (!) 97.2 F (36.2 C) (Temporal)   Resp 12   Wt 116 lb (52.6 kg)   LMP 10/22/2018   BMI 19.91 kg/m     General appearance: alert, cooperative and appears stated age  Pelvic: External genitalia:  no lesions              Urethra:  normal appearing urethra with no masses, tenderness or lesions              Bartholins and Skenes: normal                 Vagina: slightly erythematous appearing vagina with an increase in watery white/yellow d/c with bubbles               Cervix: no lesions               Chaperone was present for exam.  Wet prep: ++ clue, ? trich, +++ wbc KOH: no yeast PH: 5   ASSESSMENT Vulvovaginitis, BV on slides, +++WBC, concern for trich    PLAN Nuswab for GC/CT/Trich Treat with flagyl 500 mg po BID x 7 days Further plans depending on results Use condoms   An After Visit Summary was printed and given to the patient.

## 2018-11-17 ENCOUNTER — Other Ambulatory Visit: Payer: Self-pay | Admitting: *Deleted

## 2018-11-17 DIAGNOSIS — Z113 Encounter for screening for infections with a predominantly sexual mode of transmission: Secondary | ICD-10-CM

## 2018-11-20 LAB — CHLAMYDIA/GONOCOCCUS/TRICHOMONAS, NAA
Chlamydia by NAA: NEGATIVE
Gonococcus by NAA: NEGATIVE
Trich vag by NAA: POSITIVE — AB

## 2018-11-25 ENCOUNTER — Other Ambulatory Visit: Payer: Federal, State, Local not specified - PPO

## 2018-12-18 ENCOUNTER — Other Ambulatory Visit: Payer: Self-pay

## 2018-12-18 NOTE — Progress Notes (Deleted)
GYNECOLOGY  VISIT   HPI: 18 y.o.   Single Black or African American Not Hispanic or Latino  female   G0P0000 with No LMP recorded.   here for     GYNECOLOGIC HISTORY: No LMP recorded. Contraception:*** Menopausal hormone therapy: ***        OB History    Gravida  0   Para  0   Term  0   Preterm  0   AB  0   Living  0     SAB  0   TAB  0   Ectopic  0   Multiple  0   Live Births  0              Patient Active Problem List   Diagnosis Date Noted  . History of chlamydia   . Prediabetes     Past Medical History:  Diagnosis Date  . Abnormal uterine bleeding   . Dysmenorrhea   . History of chlamydia   . Prediabetes     No past surgical history on file.  Current Outpatient Medications  Medication Sig Dispense Refill  . medroxyPROGESTERone (DEPO-PROVERA) 150 MG/ML injection Inject 150 mg into the muscle every 3 (three) months.    . metroNIDAZOLE (FLAGYL) 500 MG tablet Take 1 tablet (500 mg total) by mouth 2 (two) times daily. 14 tablet 0   No current facility-administered medications for this visit.      ALLERGIES: Patient has no known allergies.  Family History  Problem Relation Age of Onset  . Hypertension Mother   . Diabetes Mother     Social History   Socioeconomic History  . Marital status: Single    Spouse name: Not on file  . Number of children: Not on file  . Years of education: Not on file  . Highest education level: Not on file  Occupational History  . Not on file  Social Needs  . Financial resource strain: Not on file  . Food insecurity    Worry: Not on file    Inability: Not on file  . Transportation needs    Medical: Not on file    Non-medical: Not on file  Tobacco Use  . Smoking status: Never Smoker  . Smokeless tobacco: Never Used  Substance and Sexual Activity  . Alcohol use: No    Frequency: Never  . Drug use: No  . Sexual activity: Yes    Partners: Male  Lifestyle  . Physical activity    Days per week: Not  on file    Minutes per session: Not on file  . Stress: Not on file  Relationships  . Social Herbalist on phone: Not on file    Gets together: Not on file    Attends religious service: Not on file    Active member of club or organization: Not on file    Attends meetings of clubs or organizations: Not on file    Relationship status: Not on file  . Intimate partner violence    Fear of current or ex partner: Not on file    Emotionally abused: Not on file    Physically abused: Not on file    Forced sexual activity: Not on file  Other Topics Concern  . Not on file  Social History Narrative  . Not on file    ROS  PHYSICAL EXAMINATION:    There were no vitals taken for this visit.    General appearance: alert, cooperative and  appears stated age Neck: no adenopathy, supple, symmetrical, trachea midline and thyroid {CHL AMB PHY EX THYROID NORM DEFAULT:[id number]::"normal to inspection and palpation"} Breasts: {Exam; breast:13139::"normal appearance, no masses or tenderness"} Abdomen: soft, non-tender; non distended, no masses,  no organomegaly  Pelvic: External genitalia:  no lesions              Urethra:  normal appearing urethra with no masses, tenderness or lesions              Bartholins and Skenes: normal                 Vagina: normal appearing vagina with normal color and discharge, no lesions              Cervix: {CHL AMB PHY EX CERVIX NORM [phone]::"no lesions"}              Bimanual Exam:  Uterus:  {CHL AMB PHY EX UTERUS NORM DEFAULT:[phone]::"normal size, contour, position, consistency, mobility, non-tender"}              Adnexa: {CHL AMB PHY EX ADNEXA NO MASS DEFAULT:[phone]::"no mass, fullness, tenderness"}              Rectovaginal: {yes no:314532}.  Confirms.              Anus:  normal sphincter tone, no lesions  Chaperone was present for exam.  ASSESSMENT     PLAN    An After Visit Summary was printed and given to the  patient.  *** minutes face to face time of which over 50% was spent in counseling.

## 2018-12-19 ENCOUNTER — Ambulatory Visit: Payer: Federal, State, Local not specified - PPO | Admitting: Obstetrics and Gynecology

## 2018-12-22 ENCOUNTER — Encounter: Payer: Self-pay | Admitting: Obstetrics and Gynecology

## 2018-12-22 ENCOUNTER — Ambulatory Visit: Payer: Federal, State, Local not specified - PPO | Admitting: Obstetrics and Gynecology

## 2018-12-22 NOTE — Progress Notes (Deleted)
GYNECOLOGY  VISIT   HPI: 18 y.o.   Single Black or African American Not Hispanic or Latino  female   G0P0000 with No LMP recorded.   here for     GYNECOLOGIC HISTORY: No LMP recorded. Contraception:*** Menopausal hormone therapy: ***        OB History    Gravida  0   Para  0   Term  0   Preterm  0   AB  0   Living  0     SAB  0   TAB  0   Ectopic  0   Multiple  0   Live Births  0              Patient Active Problem List   Diagnosis Date Noted  . History of chlamydia   . Prediabetes     Past Medical History:  Diagnosis Date  . Abnormal uterine bleeding   . Dysmenorrhea   . History of chlamydia   . Prediabetes     No past surgical history on file.  Current Outpatient Medications  Medication Sig Dispense Refill  . medroxyPROGESTERone (DEPO-PROVERA) 150 MG/ML injection Inject 150 mg into the muscle every 3 (three) months.    . metroNIDAZOLE (FLAGYL) 500 MG tablet Take 1 tablet (500 mg total) by mouth 2 (two) times daily. 14 tablet 0   No current facility-administered medications for this visit.      ALLERGIES: Patient has no known allergies.  Family History  Problem Relation Age of Onset  . Hypertension Mother   . Diabetes Mother     Social History   Socioeconomic History  . Marital status: Single    Spouse name: Not on file  . Number of children: Not on file  . Years of education: Not on file  . Highest education level: Not on file  Occupational History  . Not on file  Social Needs  . Financial resource strain: Not on file  . Food insecurity    Worry: Not on file    Inability: Not on file  . Transportation needs    Medical: Not on file    Non-medical: Not on file  Tobacco Use  . Smoking status: Never Smoker  . Smokeless tobacco: Never Used  Substance and Sexual Activity  . Alcohol use: No    Frequency: Never  . Drug use: No  . Sexual activity: Yes    Partners: Male  Lifestyle  . Physical activity    Days per week: Not  on file    Minutes per session: Not on file  . Stress: Not on file  Relationships  . Social Herbalist on phone: Not on file    Gets together: Not on file    Attends religious service: Not on file    Active member of club or organization: Not on file    Attends meetings of clubs or organizations: Not on file    Relationship status: Not on file  . Intimate partner violence    Fear of current or ex partner: Not on file    Emotionally abused: Not on file    Physically abused: Not on file    Forced sexual activity: Not on file  Other Topics Concern  . Not on file  Social History Narrative  . Not on file    ROS  PHYSICAL EXAMINATION:    There were no vitals taken for this visit.    General appearance: alert, cooperative and  appears stated age Neck: no adenopathy, supple, symmetrical, trachea midline and thyroid {CHL AMB PHY EX THYROID NORM DEFAULT:[id number]::"normal to inspection and palpation"} Breasts: {Exam; breast:13139::"normal appearance, no masses or tenderness"} Abdomen: soft, non-tender; non distended, no masses,  no organomegaly  Pelvic: External genitalia:  no lesions              Urethra:  normal appearing urethra with no masses, tenderness or lesions              Bartholins and Skenes: normal                 Vagina: normal appearing vagina with normal color and discharge, no lesions              Cervix: {CHL AMB PHY EX CERVIX NORM DEFAULT:[id number]::"no lesions"}              Bimanual Exam:  Uterus:  {CHL AMB PHY EX UTERUS NORM DEFAULT:[id number]::"normal size, contour, position, consistency, mobility, non-tender"}              Adnexa: {CHL AMB PHY EX ADNEXA NO MASS DEFAULT:[id number]::"no mass, fullness, tenderness"}              Rectovaginal: {yes no:314532}.  Confirms.              Anus:  normal sphincter tone, no lesions  Chaperone was present for exam.  ASSESSMENT     PLAN    An After Visit Summary was printed and given to the  patient.  *** minutes face to face time of which over 50% was spent in counseling.    

## 2019-01-08 ENCOUNTER — Telehealth: Payer: Self-pay | Admitting: Obstetrics and Gynecology

## 2019-01-08 NOTE — Telephone Encounter (Signed)
Spoke with patient. Recoved forst depo while on menses 10/27/18. Bleeding started again 12/04/18 and has continued to date. Changing pad 2-3 x/day. Mild menses cramps. Denies fatigue, lightheadedness, weakness, SOB. Next depo scheduled for 01/12/19. Not currently SA.   Patient is not interested in switching from depo-provera at this time. Advised patient to continue to monitor menses, if bleeding continues, becomes heavy, changing saturated pad q1-2 hrs or new symptoms develop, will need OV for further evaluation. Advised to take UPT if any chance of pregnancy. Advised Dr. Talbert Nan will review, our office will return call if any additional recommendations. Patient agreeable.   Routing to provider for final review. Patient is agreeable to disposition. Will close encounter.

## 2019-01-08 NOTE — Telephone Encounter (Signed)
Left message to call Sariya Trickey, RN at [facility] [phone].   

## 2019-01-08 NOTE — Telephone Encounter (Signed)
Patient is calling regarding abnormal bleeding. Patient stated that she has been on her cycle for a month. Patient stated that when she came for her Depo injection on 10/27/2018 she was on her cycle. Patient stated that she started her cycle again on September 17 and has not stopped bleeding.

## 2019-01-08 NOTE — Telephone Encounter (Signed)
Patient returned call

## 2019-01-12 ENCOUNTER — Encounter: Payer: Self-pay | Admitting: Obstetrics and Gynecology

## 2019-01-12 ENCOUNTER — Ambulatory Visit (INDEPENDENT_AMBULATORY_CARE_PROVIDER_SITE_OTHER): Payer: Federal, State, Local not specified - PPO | Admitting: Obstetrics and Gynecology

## 2019-01-12 ENCOUNTER — Other Ambulatory Visit: Payer: Self-pay

## 2019-01-12 VITALS — BP 106/76 | HR 64 | Temp 97.3°F | Wt 116.6 lb

## 2019-01-12 DIAGNOSIS — Z8619 Personal history of other infectious and parasitic diseases: Secondary | ICD-10-CM

## 2019-01-12 DIAGNOSIS — Z113 Encounter for screening for infections with a predominantly sexual mode of transmission: Secondary | ICD-10-CM

## 2019-01-12 MED ORDER — MEDROXYPROGESTERONE ACETATE 150 MG/ML IM SUSP
150.0000 mg | Freq: Once | INTRAMUSCULAR | Status: AC
  Administered 2019-01-12: 150 mg via INTRAMUSCULAR

## 2019-01-12 NOTE — Progress Notes (Signed)
Patient is here for Depo Provera Injection Patient is within Depo Provera Calender Limits. Next Depo Due between: Jan 11-25 Last AEX: 10/06/2018 AEX Scheduled: 10/08/2019  Patient is aware when next depo is due  Pt tolerated Injection well.  Routed to provider for review, encounter closed.

## 2019-01-12 NOTE — Patient Instructions (Signed)
Trichomoniasis Trichomoniasis is an STI (sexually transmitted infection) that can affect both women and men. In women, the outer area of the female genitalia (vulva) and the vagina are affected. In men, mainly the penis is affected, but the prostate and other reproductive organs can also be involved.  This condition can be treated with medicine. It often has no symptoms (is asymptomatic), especially in men. If not treated, trichomoniasis can last for months or years. What are the causes? This condition is caused by a parasite called Trichomonas vaginalis. Trichomoniasis most often spreads from person to person (is contagious) through sexual contact. What increases the risk? The following factors may make you more likely to develop this condition:  Having unprotected sex.  Having sex with a partner who has trichomoniasis.  Having multiple sexual partners.  Having had previous trichomoniasis infections or other STIs. What are the signs or symptoms? In women, symptoms of trichomoniasis include:  Abnormal vaginal discharge that is clear, white, gray, or yellow-green and foamy and has an unusual "fishy" odor.  Itching and irritation of the vagina and vulva.  Burning or pain during urination or sex.  Redness and swelling of the genitals. In men, symptoms of trichomoniasis include:  Penile discharge that may be foamy or contain pus.  Pain in the penis. This may happen only when urinating.  Itching or irritation inside the penis.  Burning after urination or ejaculation. How is this diagnosed? In women, this condition may be found during a routine Pap test or physical exam. It may be found in men during a routine physical exam. Your health care provider may do tests to help diagnose this infection, such as:  Urine tests (men and women).  The following in women: ? Testing the pH of the vagina. ? A vaginal swab test that checks for the Trichomonas vaginalis parasite. ? Testing vaginal  secretions. Your health care provider may test you for other STIs, including HIV (human immunodeficiency virus). How is this treated? This condition is treated with medicine taken by mouth (orally), such as metronidazole or tinidazole, to fight the infection. Your sexual partner(s) also need to be tested and treated.  If you are a woman and you plan to become pregnant or think you may be pregnant, tell your health care provider right away. Some medicines that are used to treat the infection should not be taken during pregnancy. Your health care provider may recommend over-the-counter medicines or creams to help relieve itching or irritation. You may be tested for infection again 3 months after treatment. Follow these instructions at home:  Take and use over-the-counter and prescription medicines, including creams, only as told by your health care provider.  Take your antibiotic medicine as told by your health care provider. Do not stop taking the antibiotic even if you start to feel better.  Do not have sex until 7-10 days after you finish your medicine, or until your health care provider approves. Ask your health care provider when you may start to have sex again.  (Women) Do not douche or wear tampons while you have the infection.  Discuss your infection with your sexual partner(s). Make sure that your partner gets tested and treated, if necessary.  Keep all follow-up visits as told by your health care provider. This is important. How is this prevented?   Use condoms every time you have sex. Using condoms correctly and consistently can help protect against STIs.  Avoid having multiple sexual partners.  Talk with your sexual partner about any   symptoms that either of you may have, as well as any history of STIs.  Get tested for STIs and STDs (sexually transmitted diseases) before you have sex. Ask your partner to do the same.  Do not have sexual contact if you have symptoms of  trichomoniasis or another STI. Contact a health care provider if:  You still have symptoms after you finish your medicine.  You develop pain in your abdomen.  You have pain when you urinate.  You have bleeding after sex.  You develop a rash.  You feel nauseous or you vomit.  You plan to become pregnant or think you may be pregnant. Summary  Trichomoniasis is an STI (sexually transmitted infection) that can affect both women and men.  This condition often has no symptoms (is asymptomatic), especially in men.  Without treatment, this condition can last for months or years.  You should not have sex until 7-10 days after you finish your medicine, or until your health care provider approves. Ask your health care provider when you may start to have sex again.  Discuss your infection with your sexual partner(s). Make sure that your partner gets tested and treated, if necessary. This information is not intended to replace advice given to you by your health care provider. Make sure you discuss any questions you have with your health care provider. Document Released: 08/29/2000 Document Revised: 12/17/2017 Document Reviewed: 12/17/2017 Elsevier Patient Education  2020 Elsevier Inc.  

## 2019-01-12 NOTE — Progress Notes (Signed)
GYNECOLOGY  VISIT   HPI: 18 y.o.   Single Black or African American Not Hispanic or Latino  female   G0P0000 with Patient's last menstrual period was 12/04/2018 (exact date).   here for TOC of trich and Depo    GYNECOLOGIC HISTORY: Patient's last menstrual period was 12/04/2018 (exact date). Contraception: Depo Menopausal hormone therapy: None        OB History    Gravida  0   Para  0   Term  0   Preterm  0   AB  0   Living  0     SAB  0   TAB  0   Ectopic  0   Multiple  0   Live Births  0              Patient Active Problem List   Diagnosis Date Noted  . History of chlamydia   . Prediabetes     Past Medical History:  Diagnosis Date  . Abnormal uterine bleeding   . Dysmenorrhea   . History of chlamydia   . Prediabetes     History reviewed. No pertinent surgical history.  Current Outpatient Medications  Medication Sig Dispense Refill  . medroxyPROGESTERone (DEPO-PROVERA) 150 MG/ML injection Inject 150 mg into the muscle every 3 (three) months.     No current facility-administered medications for this visit.      ALLERGIES: Patient has no known allergies.  Family History  Problem Relation Age of Onset  . Hypertension Mother   . Diabetes Mother     Social History   Socioeconomic History  . Marital status: Single    Spouse name: Not on file  . Number of children: Not on file  . Years of education: Not on file  . Highest education level: Not on file  Occupational History  . Not on file  Social Needs  . Financial resource strain: Not on file  . Food insecurity    Worry: Not on file    Inability: Not on file  . Transportation needs    Medical: Not on file    Non-medical: Not on file  Tobacco Use  . Smoking status: Never Smoker  . Smokeless tobacco: Never Used  Substance and Sexual Activity  . Alcohol use: No    Frequency: Never  . Drug use: No  . Sexual activity: Not Currently    Partners: Male    Birth control/protection:  Injection  Lifestyle  . Physical activity    Days per week: Not on file    Minutes per session: Not on file  . Stress: Not on file  Relationships  . Social Musician on phone: Not on file    Gets together: Not on file    Attends religious service: Not on file    Active member of club or organization: Not on file    Attends meetings of clubs or organizations: Not on file    Relationship status: Not on file  . Intimate partner violence    Fear of current or ex partner: Not on file    Emotionally abused: Not on file    Physically abused: Not on file    Forced sexual activity: Not on file  Other Topics Concern  . Not on file  Social History Narrative  . Not on file    Review of Systems  Constitutional: Negative.   HENT: Negative.   Eyes: Negative.   Respiratory: Negative.   Cardiovascular: Negative.  Gastrointestinal: Negative.   Genitourinary: Negative.   Musculoskeletal: Negative.   Skin: Negative.   Neurological: Negative.   Endo/Heme/Allergies: Negative.   Psychiatric/Behavioral: Negative.     PHYSICAL EXAMINATION:    BP 106/76 (BP Location: Right Arm, Patient Position: Sitting, Cuff Size: Normal)   Pulse 64   Temp (!) 97.3 F (36.3 C) (Skin)   Wt 116 lb 9.6 oz (52.9 kg)   LMP 12/04/2018 (Exact Date)   BMI 20.01 kg/m     General appearance: alert, cooperative and appears stated age  Pelvic: External genitalia:  no lesions              Urethra:  normal appearing urethra with no masses, tenderness or lesions              Bartholins and Skenes: normal                 Vagina: normal appearing vagina with normal color and discharge, no lesions, small amount of blood in the vagina              Cervix:  no lesions  Chaperone was present for exam.  ASSESSMENT H/O trich vaginitis, treated    PLAN TOC for trich, STD testing   An After Visit Summary was printed and given to the patient.

## 2019-01-13 LAB — CHLAMYDIA/GONOCOCCUS/TRICHOMONAS, NAA
Chlamydia by NAA: NEGATIVE
Gonococcus by NAA: NEGATIVE
Trich vag by NAA: NEGATIVE

## 2019-01-14 LAB — HIV ANTIBODY (ROUTINE TESTING W REFLEX): HIV Screen 4th Generation wRfx: NONREACTIVE

## 2019-01-14 LAB — RPR: RPR Ser Ql: NONREACTIVE

## 2019-03-26 ENCOUNTER — Telehealth: Payer: Self-pay | Admitting: Obstetrics and Gynecology

## 2019-03-26 NOTE — Telephone Encounter (Signed)
Patient says she has a horrible headache and nausea. Wondering if it could be from her birth control.

## 2019-03-26 NOTE — Telephone Encounter (Signed)
Spoke with patient. Patient reports "horrible headache", nausea and decreased appetite for the last 2-3 wks. Patient states she wakes up with the headache, 4/10. Not relieved by OTC pain meds. Depo -provera for contraceptive. Last injection received 01/12/19. Due 1/11 -04/13/19. LMP 02/18/19.   Patient denies dizziness, lightheadedness, vision changes, vomiting, fever/chills, pelvic pain, SOB, weakness, fatigue, cough, runny nose.  Patient reports she has a lump on right breast that is still present, she is scheduled for f/u at [facility] on 04/09/19. No changes.   Instructed patient to f/u with PCP/Urgent Care/ER for further evaluation of symptoms, if OV still needed with GYN, return call to office. Advised I will forward to Dr. Oscar La to review, our office will return call if any additional recommendations. Patient agreeable.   Routing to Dr. Oscar La for final review.

## 2019-03-31 NOTE — Progress Notes (Deleted)
Patient is here for Depo Provera Injection Patient is within Depo Provera Calender Limits 1/11-1/25/2021 Next Depo Due between: 3/31-4/14/2021 Last AEX: 10/06/2018 AEX Scheduled: 10/08/2019  Patient is aware when next depo is due  Pt tolerated Injection well.  Routed to provider for review, encounter closed.

## 2019-04-01 ENCOUNTER — Ambulatory Visit: Payer: Federal, State, Local not specified - PPO

## 2019-04-02 ENCOUNTER — Ambulatory Visit (INDEPENDENT_AMBULATORY_CARE_PROVIDER_SITE_OTHER): Payer: Federal, State, Local not specified - PPO | Admitting: Family Medicine

## 2019-04-02 DIAGNOSIS — G43809 Other migraine, not intractable, without status migrainosus: Secondary | ICD-10-CM

## 2019-04-02 DIAGNOSIS — G43909 Migraine, unspecified, not intractable, without status migrainosus: Secondary | ICD-10-CM | POA: Insufficient documentation

## 2019-04-02 MED ORDER — SUMATRIPTAN SUCCINATE 25 MG PO TABS: 25.0000 mg | ORAL_TABLET | ORAL | 0 refills | Status: DC | PRN

## 2019-04-02 NOTE — Progress Notes (Signed)
   Marisa Snyder is a 19 y.o. female who presents today for a virtual office visit.  Assessment/Plan:  Chronic Problems Addressed Today: Migraine Acute flare.  No red flags.  Symptoms consistent with prior migraines.  Will start Imitrex.  Recommended good oral hydration.  It is possible Depo-Provera could be contributing to migraines-advised her to follow-up with OB/GYN to see if there are any other alternatives to contraception.     Subjective:  HPI:  Current migraine started 2-3 weeks ago.  Stable over that time. Normally get in the morning. Was taking aspirin which did not help. Midol has helped. No vision changes. No weakness or numbness. Feels similar to prior migraines. Received depo provera         Objective/Observations  Physical Exam: Gen: NAD, resting comfortably Pulm: Normal work of breathing Neuro: Grossly normal, moves all extremities Psych: Normal affect and thought content  Virtual Visit via Video   I connected with Taquanna Olivencia on 04/02/19 at 11:40 AM EST by a video enabled telemedicine application and verified that I am speaking with the correct person using two identifiers. The limitations of evaluation and management by telemedicine and the availability of in person appointments were discussed. The patient expressed understanding and agreed to proceed.   Patient location: Home Provider location: [facility] [employer] [facility] Persons participating in the virtual visit: Myself and Retta Diones. Jimmey Ralph, MD 04/02/2019 11:56 AM

## 2019-04-02 NOTE — Assessment & Plan Note (Signed)
Acute flare.  No red flags.  Symptoms consistent with prior migraines.  Will start Imitrex.  Recommended good oral hydration.  It is possible Depo-Provera could be contributing to migraines-advised her to follow-up with OB/GYN to see if there are any other alternatives to contraception.

## 2019-04-03 ENCOUNTER — Encounter: Payer: Self-pay | Admitting: Obstetrics and Gynecology

## 2019-04-03 ENCOUNTER — Ambulatory Visit (INDEPENDENT_AMBULATORY_CARE_PROVIDER_SITE_OTHER): Payer: Federal, State, Local not specified - PPO | Admitting: Obstetrics and Gynecology

## 2019-04-03 ENCOUNTER — Other Ambulatory Visit: Payer: Self-pay

## 2019-04-03 VITALS — BP 100/60 | HR 65 | Temp 97.9°F | Ht 65.0 in | Wt 116.8 lb

## 2019-04-03 DIAGNOSIS — Z3042 Encounter for surveillance of injectable contraceptive: Secondary | ICD-10-CM | POA: Diagnosis not present

## 2019-04-03 MED ORDER — MEDROXYPROGESTERONE ACETATE 150 MG/ML IM SUSP
150.0000 mg | Freq: Once | INTRAMUSCULAR | Status: AC
  Administered 2019-04-03: 11:00:00 150 mg via INTRAMUSCULAR

## 2019-04-03 NOTE — Progress Notes (Signed)
Patient is here for Depo Provera Injection Patient is within Depo Provera Calender Limits 1/11-1/25 Next Depo Due between: 4/02-4/16/2021 Last AEX: 10/06/2018 AEX Scheduled: 10/08/2019 with JJ  Patient is aware when next depo is due  Pt tolerated Injection well.   Routed to provider for review, encounter closed.

## 2019-04-09 ENCOUNTER — Other Ambulatory Visit: Payer: Self-pay

## 2019-04-09 ENCOUNTER — Ambulatory Visit
Admission: RE | Admit: 2019-04-09 | Discharge: 2019-04-09 | Disposition: A | Discharge status: Home / Self Care | Payer: Federal, State, Local not specified - PPO | Source: Ambulatory Visit | Attending: Obstetrics and Gynecology | Admitting: Obstetrics and Gynecology

## 2019-04-09 DIAGNOSIS — N6312 Unspecified lump in the right breast, upper inner quadrant: Secondary | ICD-10-CM

## 2019-06-22 ENCOUNTER — Ambulatory Visit: Payer: Federal, State, Local not specified - PPO

## 2019-06-22 NOTE — Progress Notes (Deleted)
Patient is here for Depo Provera Injection Patient is within Depo Provera Calender Limits Yes, 4/2-4/16 Next Depo Due between: June 21-July 5 Last AEX: 10/06/2018 AEX Scheduled: 10/08/2019  Patient is aware when next depo is due  Pt tolerated Injection well in RUOQ  Routed to provider for review, encounter closed.

## 2019-06-23 ENCOUNTER — Other Ambulatory Visit: Payer: Self-pay

## 2019-06-23 ENCOUNTER — Ambulatory Visit: Payer: Federal, State, Local not specified - PPO | Admitting: Obstetrics and Gynecology

## 2019-06-23 ENCOUNTER — Encounter (INDEPENDENT_AMBULATORY_CARE_PROVIDER_SITE_OTHER): Payer: Federal, State, Local not specified - PPO

## 2019-06-23 NOTE — Progress Notes (Signed)
Patient in office for Depo-Provera injection. Patient states that she has been having spotting that started 05/16/19 and ended 06/07/19. On 06/09/19 patient started bleeding again, changing pads 2-3 times a day. Patient is still having bleeding as of today, 06/23/19. Reviewed with Dr. Oscar La, depo-provera not administered to patient and okay to put on the schedule for office visit. Patient has been placed on the schedule for 06/25/19 at 3:00p to see Dr. Oscar La.  Patient verbalizes understanding and agreeable to plan.  Routing to provider and will close encounter.

## 2019-06-23 NOTE — Progress Notes (Deleted)
Patient is here for Depo Provera Injection Patient is within Depo Provera Calender Limits Yes, 4/2-4/16 Next Depo Due between: June 22-July 6  Last AEX: 10/06/2018 AEX Scheduled: 10/08/2019  Patient is aware when next depo is due  Pt tolerated Injection well in RUOQ  Routed to provider for review, encounter closed.

## 2019-06-25 ENCOUNTER — Ambulatory Visit (INDEPENDENT_AMBULATORY_CARE_PROVIDER_SITE_OTHER): Payer: Federal, State, Local not specified - PPO | Admitting: Obstetrics and Gynecology

## 2019-06-25 ENCOUNTER — Other Ambulatory Visit: Payer: Self-pay

## 2019-06-25 ENCOUNTER — Encounter: Payer: Self-pay | Admitting: Obstetrics and Gynecology

## 2019-06-25 VITALS — BP 100/60 | HR 71 | Temp 98.2°F | Ht 65.0 in | Wt 114.7 lb

## 2019-06-25 DIAGNOSIS — N921 Excessive and frequent menstruation with irregular cycle: Secondary | ICD-10-CM

## 2019-06-25 DIAGNOSIS — Z3042 Encounter for surveillance of injectable contraceptive: Secondary | ICD-10-CM | POA: Diagnosis not present

## 2019-06-25 LAB — POCT URINE PREGNANCY: Preg Test, Ur: NEGATIVE

## 2019-06-25 MED ORDER — MEDROXYPROGESTERONE ACETATE 150 MG/ML IM SUSP
150.0000 mg | Freq: Once | INTRAMUSCULAR | Status: AC
  Administered 2019-06-25: 150 mg via INTRAMUSCULAR

## 2019-06-25 NOTE — Progress Notes (Signed)
GYNECOLOGY  VISIT   HPI: 19 y.o.   Single Black or African American Not Hispanic or Latino  female   G0P0000 with No LMP recorded. Patient has had an injection.   here for bleeding while on dep. Patient says that her bleeding has lessened.   Started Depo-provera in 8/20. She was having monthly cycles, normal through December. Starting in January she bleed lightly the whole month, since then she had 2 weeks without bleeding. She started bleeding at the end of February and has bleed daily with the exception of 2 days in March. It has been mainly spotting since January. In the last 2 weeks she was changing her pad 2-3 x a day. Yesterday the bleeding started to get light again.   GYNECOLOGIC HISTORY: No LMP recorded. Patient has had an injection. Contraception:Depo Menopausal hormone therapy: none        OB History    Gravida  0   Para  0   Term  0   Preterm  0   AB  0   Living  0     SAB  0   TAB  0   Ectopic  0   Multiple  0   Live Births  0              Patient Active Problem List   Diagnosis Date Noted  . Migraine 04/02/2019  . History of chlamydia   . Prediabetes     Past Medical History:  Diagnosis Date  . Abnormal uterine bleeding   . Dysmenorrhea   . History of chlamydia   . Prediabetes     No past surgical history on file.  Current Outpatient Medications  Medication Sig Dispense Refill  . medroxyPROGESTERone (DEPO-PROVERA) 150 MG/ML injection Inject 150 mg into the muscle every 3 (three) months.     No current facility-administered medications for this visit.     ALLERGIES: Patient has no known allergies.  Family History  Problem Relation Age of Onset  . Hypertension Mother   . Diabetes Mother     Social History   Socioeconomic History  . Marital status: Single    Spouse name: Not on file  . Number of children: Not on file  . Years of education: Not on file  . Highest education level: Not on file  Occupational History  . Not on  file  Tobacco Use  . Smoking status: Never Smoker  . Smokeless tobacco: Never Used  Substance and Sexual Activity  . Alcohol use: No  . Drug use: No  . Sexual activity: Not Currently    Partners: Male    Birth control/protection: Injection  Other Topics Concern  . Not on file  Social History Narrative  . Not on file   Social Determinants of Health   Financial Resource Strain:   . Difficulty of Paying Living Expenses:   Food Insecurity:   . Worried About Programme researcher, broadcasting/film/video in the Last Year:   . Barista in the Last Year:   Transportation Needs:   . Freight forwarder (Medical):   Marland Kitchen Lack of Transportation (Non-Medical):   Physical Activity:   . Days of Exercise per Week:   . Minutes of Exercise per Session:   Stress:   . Feeling of Stress :   Social Connections:   . Frequency of Communication with Friends and Family:   . Frequency of Social Gatherings with Friends and Family:   . Attends Religious Services:   .  Active Member of Clubs or Organizations:   . Attends Archivist Meetings:   Marland Kitchen Marital Status:   Intimate Partner Violence:   . Fear of Current or Ex-Partner:   . Emotionally Abused:   Marland Kitchen Physically Abused:   . Sexually Abused:     Review of Systems  Constitutional: Negative.   HENT: Negative.   Eyes: Negative.   Respiratory: Negative.   Cardiovascular: Negative.   Genitourinary: Negative.   Musculoskeletal: Negative.   Skin: Negative.   Neurological: Negative.   Endo/Heme/Allergies: Negative.   Psychiatric/Behavioral: Negative.     PHYSICAL EXAMINATION:    BP 100/60   Pulse 71   Temp 98.2 F (36.8 C)   Ht 5\' 5"  (1.651 m)   Wt 114 lb 11.2 oz (52 kg)   SpO2 99%   BMI 19.09 kg/m     General appearance: alert, cooperative and appears stated age Abdomen: soft, non-tender; non distended, no masses,  no organomegaly  Pelvic: External genitalia:  no lesions              Urethra:  normal appearing urethra with no masses,  tenderness or lesions              Bartholins and Skenes: normal                 Vagina: normal appearing vagina with normal color and discharge, no lesions, small to moderate amount of blood in her vagina.              Cervix: no cervical motion tenderness and no lesions              Bimanual Exam:  Uterus:  normal size, contour, position, consistency, mobility, non-tender              Adnexa: no mass, fullness, tenderness               Chaperone was present for exam.  ASSESSMENT BTB on depo-provera 8 months after starting. Normal exam    PLAN UPT CBC Cervical cultures She will try taking ibuprofen around the clock for 5 days. If that doesn't help will treat with OCP's x 3 months. No contraindication, information given (she will look at her chart on line).  Will continue with depo-provera (due today)

## 2019-06-25 NOTE — Patient Instructions (Signed)

## 2019-06-26 LAB — CBC
Hematocrit: 36.5 % (ref 34.0–46.6)
Hemoglobin: 11.7 g/dL (ref 11.1–15.9)
MCH: 24.3 pg — ABNORMAL LOW (ref 26.6–33.0)
MCHC: 32.1 g/dL (ref 31.5–35.7)
MCV: 76 fL — ABNORMAL LOW (ref 79–97)
Platelets: 328 10*3/uL (ref 150–450)
RBC: 4.81 x10E6/uL (ref 3.77–5.28)
RDW: 17.3 % — ABNORMAL HIGH (ref 11.7–15.4)
WBC: 6.5 10*3/uL (ref 3.4–10.8)

## 2019-06-27 LAB — CHLAMYDIA/GONOCOCCUS/TRICHOMONAS, NAA
Chlamydia by NAA: POSITIVE — AB
Gonococcus by NAA: NEGATIVE
Trich vag by NAA: NEGATIVE

## 2019-06-29 ENCOUNTER — Telehealth: Payer: Self-pay | Admitting: *Deleted

## 2019-06-29 MED ORDER — AZITHROMYCIN 250 MG PO TABS: 1000.0000 mg | ORAL_TABLET | Freq: Once | ORAL | 0 refills | Status: AC

## 2019-06-29 NOTE — Telephone Encounter (Signed)
Spoke with patient, advised per Dr. Oscar La. RX for azithromycin to verified pharmacy. Discussed partner therapy, patient declines for now, will return call to office if desired. OV scheduled for TOC on 09/30/19 at 11:30am with Dr. Oscar La. Patient verbalizes understanding and is agreeable.   Confidential Communicable Disease Report completed and faxed to [facility]   Encounter closed.

## 2019-06-29 NOTE — Telephone Encounter (Signed)
Leda Min, RN  06/29/2019 12:48 PM EDT    Left message to call Noreene Larsson, RN at [facility] [phone].

## 2019-06-29 NOTE — Telephone Encounter (Signed)
-----   Message from Romualdo Bolk, MD sent at 06/28/2019  2:28 PM EDT ----- Please inform the patient that she has chlamydia and treat with azithromycin 1 gram po x 1. Please offer expedited partner therapy. They should avoid intercourse until one week after they have both been treated, then use condoms. She needs a f/u cervical culture in 3 months.

## 2019-06-30 ENCOUNTER — Telehealth: Payer: Self-pay | Admitting: Obstetrics and Gynecology

## 2019-06-30 MED ORDER — AZITHROMYCIN 250 MG PO TABS: 1000.0000 mg | ORAL_TABLET | Freq: Once | ORAL | 0 refills | Status: AC

## 2019-06-30 NOTE — Telephone Encounter (Signed)
Spoke with patient. Patient was prescribed azithromycin 1 g on 06/29/19 for tx of chlamydia. Patient did not eat prior to taking medication. Took meds at 3pm, ate after. Vomited 1 hour after taking medication.   Patient called pharmacy, was advised to call office for refill. Advised patient I will review with Dr. Oscar La and f/u. Patient agreeable.   Routing to Dr. Oscar La to advise on refill. Rx pended.

## 2019-06-30 NOTE — Telephone Encounter (Signed)
Reviewed with Dr. Oscar La, ok for refill of azithromycin.   Call returned to patient, advised of refill. Encouraged patient to eat prior to taking medication. Patient verbalizes understanding and is agreeable.   Encounter closed.

## 2019-06-30 NOTE — Telephone Encounter (Signed)
Patient took medication on an empty stomach and became sick. Would like another prescription sent to pharmacy.

## 2019-07-29 ENCOUNTER — Encounter: Payer: Self-pay | Admitting: Obstetrics and Gynecology

## 2019-07-29 ENCOUNTER — Ambulatory Visit (INDEPENDENT_AMBULATORY_CARE_PROVIDER_SITE_OTHER): Payer: Federal, State, Local not specified - PPO | Admitting: Obstetrics and Gynecology

## 2019-07-29 ENCOUNTER — Other Ambulatory Visit: Payer: Self-pay

## 2019-07-29 VITALS — BP 108/60 | HR 72 | Temp 97.6°F | Ht 65.0 in | Wt 112.0 lb

## 2019-07-29 DIAGNOSIS — R1032 Left lower quadrant pain: Secondary | ICD-10-CM | POA: Diagnosis not present

## 2019-07-29 DIAGNOSIS — Z8619 Personal history of other infectious and parasitic diseases: Secondary | ICD-10-CM

## 2019-07-29 DIAGNOSIS — Z113 Encounter for screening for infections with a predominantly sexual mode of transmission: Secondary | ICD-10-CM | POA: Diagnosis not present

## 2019-07-29 DIAGNOSIS — R350 Frequency of micturition: Secondary | ICD-10-CM

## 2019-07-29 LAB — POCT URINALYSIS DIPSTICK
Bilirubin, UA: NEGATIVE
Glucose, UA: NEGATIVE
Ketones, UA: NEGATIVE
Nitrite, UA: NEGATIVE
Protein, UA: POSITIVE — AB
Urobilinogen, UA: NEGATIVE E.U./dL — AB
pH, UA: 7 (ref 5.0–8.0)

## 2019-07-29 NOTE — Progress Notes (Signed)
GYNECOLOGY  VISIT   HPI: 19 y.o.   Single Black or African American Not Hispanic or Latino  female   [id number] with No LMP recorded. Patient has had an injection.   here for TOC Chlamydia and discuss bleeding with Depo-Provera. Patient states that she has still had "off and on" spotting since last visit.     She started Depo-Provera in 8/20, regular cycles through December, irregular spotting since then. Last shot was on 06/25/19. At her visit that day she was instructed to take ibuprofen for 5 days. Her bleeding stopped with the ibuprofen, but came back. Intermittent since then, not every day, currently tolerable.  She c/o urinary urgency and frequency for the last 2 days. Voiding small amounts. No dysuria.  A couple of nights ago she had a several hour episode of severe pain in her LLQ. Started after eating. Slight help with BM, then it just went away.   She and her partner were treated for chlamydia one month ago, not sexually active since then, still together. She would like the STD blood work today (not done since last year).   She c/o weight loss, down 4 lbs since last summer, just doesn't have an appetitive. She is under stress with school and work. She works night shifts at [employer] and goes to school (both full time). 2 more years of school, studying social work. Lives with a friend.   GYNECOLOGIC HISTORY: No LMP recorded. Patient has had an injection. Contraception:Depo-Provera Menopausal hormone therapy: n/a        OB History    Gravida  0   Para  0   Term  0   Preterm  0   AB  0   Living  0     SAB  0   TAB  0   Ectopic  0   Multiple  0   Live Births  0              Patient Active Problem List   Diagnosis Date Noted  . Migraine 04/02/2019  . History of chlamydia   . Prediabetes     Past Medical History:  Diagnosis Date  . Abnormal uterine bleeding   . Dysmenorrhea   . History of chlamydia   . Prediabetes     History reviewed. No pertinent surgical  history.  Current Outpatient Medications  Medication Sig Dispense Refill  . medroxyPROGESTERone (DEPO-PROVERA) 150 MG/ML injection Inject 150 mg into the muscle every 3 (three) months.     No current facility-administered medications for this visit.     ALLERGIES: Patient has no known allergies.  Family History  Problem Relation Age of Onset  . Hypertension Mother   . Diabetes Mother     Social History   Socioeconomic History  . Marital status: Single    Spouse name: Not on file  . Number of children: Not on file  . Years of education: Not on file  . Highest education level: Not on file  Occupational History  . Not on file  Tobacco Use  . Smoking status: Never Smoker  . Smokeless tobacco: Never Used  Substance and Sexual Activity  . Alcohol use: No  . Drug use: No  . Sexual activity: Not Currently    Partners: Male    Birth control/protection: Injection  Other Topics Concern  . Not on file  Social History Narrative  . Not on file   Social Determinants of Health   Financial Resource Strain:   .  Difficulty of Paying Living Expenses:   Food Insecurity:   . Worried About Programme researcher, broadcasting/film/video in the Last Year:   . Barista in the Last Year:   Transportation Needs:   . Freight forwarder (Medical):   Marland Kitchen Lack of Transportation (Non-Medical):   Physical Activity:   . Days of Exercise per Week:   . Minutes of Exercise per Session:   Stress:   . Feeling of Stress :   Social Connections:   . Frequency of Communication with Friends and Family:   . Frequency of Social Gatherings with Friends and Family:   . Attends Religious Services:   . Active Member of Clubs or Organizations:   . Attends Banker Meetings:   Marland Kitchen Marital Status:   Intimate Partner Violence:   . Fear of Current or Ex-Partner:   . Emotionally Abused:   Marland Kitchen Physically Abused:   . Sexually Abused:     Review of Systems  Constitutional: Negative.   HENT: Negative.   Eyes:  Negative.   Respiratory: Negative.   Cardiovascular: Negative.   Gastrointestinal: Negative.   Genitourinary: Negative.   Musculoskeletal: Negative.   Skin: Negative.   Neurological: Negative.   Endo/Heme/Allergies: Negative.   Psychiatric/Behavioral: Negative.     PHYSICAL EXAMINATION:    BP 108/60 (BP Location: Right Arm, Patient Position: Sitting, Cuff Size: Normal)   Pulse 72   Temp 97.6 F (36.4 C) (Temporal)   Ht 5\' 5"  (1.651 m)   Wt 112 lb (50.8 kg)   BMI 18.64 kg/m     General appearance: alert, cooperative and appears stated age Abdomen: soft, non-tender; non distended, no masses,  no organomegaly CVA: mildly tender bilaterally  Pelvic: External genitalia:  no lesions              Urethra:  normal appearing urethra with no masses, tenderness or lesions              Bartholins and Skenes: normal                 Vagina: normal appearing vagina with normal color and discharge, no lesions              Cervix: no cervical motion tenderness and no lesions              Bimanual Exam:  Uterus:  normal size, contour, position, consistency, mobility, non-tender and anteverted              Adnexa: no mass, fullness, tenderness                Chaperone was present for exam.  Moderate blood, trace protein, trace leuks  ASSESSMENT H/O chlamydia, s/p treatment one month ago.  Abdominal pain, resolved, normal exam Urinary frequency and urgency    PLAN Nuswab for GC/CT STD blood work Condom use encouraged Discussed risks of tubal damage with Chlamydia and need for early evaluation when pregnant Urine for ua, c&s Will hold on antibiotics until her culture returns or symptoms worsen.

## 2019-07-29 NOTE — Patient Instructions (Signed)
Chlamydia, Female Chlamydia is an STD (sexually transmitted disease). It is a bacterial infection that spreads (is contagious) through sexual contact. Chlamydia can occur in different areas of the body, including:  The tube that moves urine from the bladder out of the body (urethra).  The lower part of the uterus (cervix).  The throat.  The rectum. This condition is not difficult to treat. However, if left untreated, chlamydia can lead to more serious health problems, including pelvic inflammatory disorder (PID). PID can increase your risk of not being able to have children (sterility). Also, if chlamydia is left untreated and you are pregnant or become pregnant, there is a chance that your baby can become infected during delivery. This may cause serious health problems for the baby. What are the causes? Chlamydia is caused by the bacteria Chlamydia trachomatis. It is passed from an infected partner during sexual activity. Chlamydia can spread through contact with the genitals, mouth, or rectum. What are the signs or symptoms? In some cases, there may not be any symptoms for this condition (asymptomatic), especially early in the infection. If symptoms develop, they may include:  Burning with urination.  Frequent urination.  Vaginal discharge.  Redness, soreness, and swelling (inflammation) of the rectum.  Bleeding or discharge from the rectum.  Abdominal pain.  Pain during sexual intercourse.  Bleeding between menstrual periods.  Itching, burning, or redness in the eyes, or discharge from the eyes. How is this diagnosed? This condition may be diagnosed with:  Urine tests.  Swab tests. Depending on your symptoms, your health care provider may use a cotton swab to collect discharge from your vagina or rectum to test for the bacteria.  A pelvic exam. How is this treated? This condition is treated with antibiotic medicines. If you are pregnant, certain types of antibiotics will  need to be avoided. Follow these instructions at home: Medicines  Take over-the-counter and prescription medicines only as told by your health care provider.  Take your antibiotic medicine as told by your health care provider. Do not stop taking the antibiotic even if you start to feel better. Sexual activity  Tell sexual partners about your infection. This includes any oral, anal, or vaginal sex partners you have had within 60 days of when your symptoms started. Sexual partners should also be treated, even if they have no signs of the disease.  Do not have sex until you and your sexual partners have completed treatment and your health care provider says it is okay. If your health care provider prescribed you a single dose treatment, wait 7 days after taking the treatment before having sex. General instructions  It is your responsibility to get your test results. Ask your health care provider, or the department performing the test, when your results will be ready.  Get plenty of rest.  Eat a healthy, well-balanced diet.  Drink enough fluids to keep your urine clear or pale yellow.  Keep all follow-up visits as told by your health care provider. This is important. You may need to be tested for infection again 3 months after treatment. How is this prevented? The only sure way to prevent chlamydia is to avoid having sex. However, you can lower your risk by:  Using latex condoms correctly every time you have sex.  Not having multiple sexual partners.  Asking if your sexual partner has been tested for STIs and had negative results. Contact a health care provider if:  You develop new symptoms or your symptoms do not get   better after completing treatment.  You have a fever or chills.  You have pain during sexual intercourse. Get help right away if:  Your pain gets worse and does not get better with medicine.  You develop flu-like symptoms, such as night sweats, sore throat, or  muscle aches.  You experience nausea or vomiting.  You have difficulty swallowing.  You have bleeding between periods or after sex.  You have irregular menstrual periods.  You have abdominal or lower back pain that does not get better with medicine.  You feel weak or dizzy, or you faint.  You are pregnant and you develop symptoms of chlamydia. Summary  Chlamydia is an STD (sexually transmitted disease). It is a bacterial infection that spreads (is contagious) through sexual contact.  This condition is not difficult to treat, however. If left untreated, chlamydia can lead to more serious health problems, including pelvic inflammatory disease (PID).  In some cases, there may not be any symptoms for this condition (asymptomatic).  This condition is treated with antibiotic medicines.  Using latex condoms correctly every time you have sex can help prevent chlamydia. This information is not intended to replace advice given to you by your health care provider. Make sure you discuss any questions you have with your health care provider. Document Revised: 08/27/2017 Document Reviewed: 02/20/2016 Elsevier Patient Education  2020 Elsevier Inc.  

## 2019-07-30 LAB — HEPATITIS C ANTIBODY: Hep C Virus Ab: <0.1 s/co ratio (ref 0.0–0.9)

## 2019-07-30 LAB — URINALYSIS, MICROSCOPIC ONLY
Casts: NONE SEEN /lpf
RBC, Urine: >30 /hpf — AB (ref 0–2)
WBC, UA: >30 /hpf — AB (ref 0–5)

## 2019-07-30 LAB — HEP, RPR, HIV PANEL
HIV Screen 4th Generation wRfx: NONREACTIVE
Hepatitis B Surface Ag: NEGATIVE
RPR Ser Ql: NONREACTIVE

## 2019-07-31 LAB — GC/CHLAMYDIA PROBE AMP
Chlamydia trachomatis, NAA: NEGATIVE
Neisseria Gonorrhoeae by PCR: NEGATIVE

## 2019-07-31 LAB — URINE CULTURE

## 2019-08-03 ENCOUNTER — Telehealth: Payer: Self-pay

## 2019-08-03 MED ORDER — NORETHIN ACE-ETH ESTRAD-FE 1-20 MG-MCG PO TABS
1.0000 | ORAL_TABLET | Freq: Every day | ORAL | 0 refills | Status: DC
Start: 2019-08-03 — End: 2023-10-31

## 2019-08-03 MED ORDER — SULFAMETHOXAZOLE-TRIMETHOPRIM 800-160 MG PO TABS
1.0000 | ORAL_TABLET | Freq: Two times a day (BID) | ORAL | 0 refills | Status: AC
Start: 2019-08-03 — End: 2019-08-06

## 2019-08-03 NOTE — Telephone Encounter (Signed)
Spoke with pt. Pt given results per Dr Oscar La. Pt agreeable. Rx Bactrim DS 1 PO BID x 3 days sent into pharmacy on file.   Pt states wanting to know if Dr Oscar La called in [facility] for her to start. Pt states last Depo Provera injection was 06/25/19. Pt states taking Ibuprofen as directed has not resolved and still having BTB. Reviewed notes from 07/29/19 OV, no OCPs mentioned. Reviewed OV notes from 06/25/19  per OV on 06/25/19: She will try taking ibuprofen around the clock for 5 days. If that doesn't help will treat with OCP's x 3 months.  Advised will review with Dr Oscar La and return call to pt with recommendations. Pt agreeable.   Routing to Dr Oscar La.

## 2019-08-03 NOTE — Telephone Encounter (Signed)
Spoke with pt. Pt given recommendations per Dr Oscar La. Pt agreeable. Rx Loestrin FE 1/20 # 3 packages, 0RF sent to pharmacy on file for BTB.  Pt verbalized understanding. Pt unsure if wants to continue with Depo. Will keep appt for now on 09/09/19 for next Depo injection. Will return call to office once taking OCPs to help with BTB and if any concerns or problems.   Routing to Dr Oscar La for review.  Encounter closed.

## 2019-08-03 NOTE — Telephone Encounter (Signed)
If she is continuing to have BTB on the depo-provera, please call in loestrin 1/20 x 3 months. If she wants to continue the depo provera she should get it when due.

## 2019-08-03 NOTE — Telephone Encounter (Signed)
-----   Message from Romualdo Bolk, MD sent at 08/03/2019  8:08 AM EDT ----- Please let the patient know that her urine culture is c/w an infection and treat her with Bactrim 1 po BID x 3 days. Her cervical cultures are negative for infection.

## 2019-09-09 ENCOUNTER — Ambulatory Visit: Payer: Federal, State, Local not specified - PPO

## 2019-09-30 ENCOUNTER — Ambulatory Visit: Payer: Self-pay | Admitting: Obstetrics and Gynecology

## 2019-10-08 ENCOUNTER — Ambulatory Visit: Payer: Federal, State, Local not specified - PPO | Admitting: Obstetrics and Gynecology

## 2019-10-23 ENCOUNTER — Other Ambulatory Visit: Payer: Self-pay | Admitting: Obstetrics and Gynecology

## 2020-09-08 DIAGNOSIS — R300 Dysuria: Secondary | ICD-10-CM | POA: Diagnosis not present

## 2020-12-20 IMAGING — US ULTRASOUND RIGHT BREAST LIMITED
1 series · 6 of 6 positions shown · non-contrast
Comparison: Previous exam(s).

CLINICAL DATA: Palpable lump in the right breast

EXAM:
ULTRASOUND OF THE RIGHT BREAST

[Series 1: ultrasound right breast limited · 0.07mm/px · 6 of 6 slices shown]
[im 1/6]
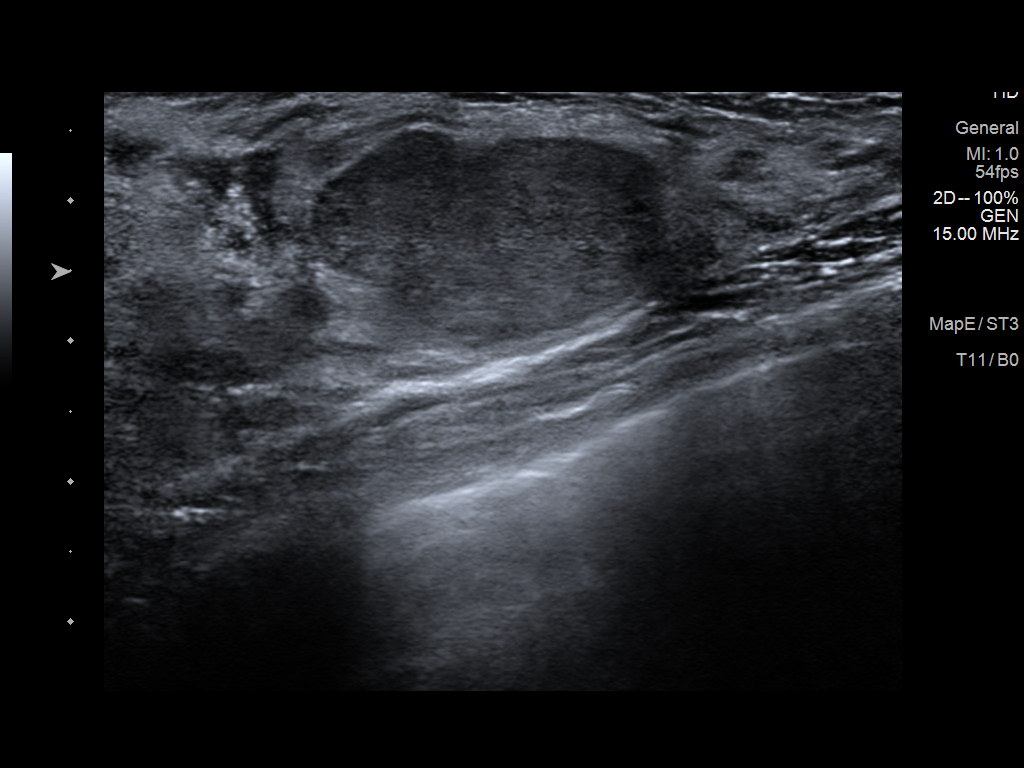
[im 2/6]
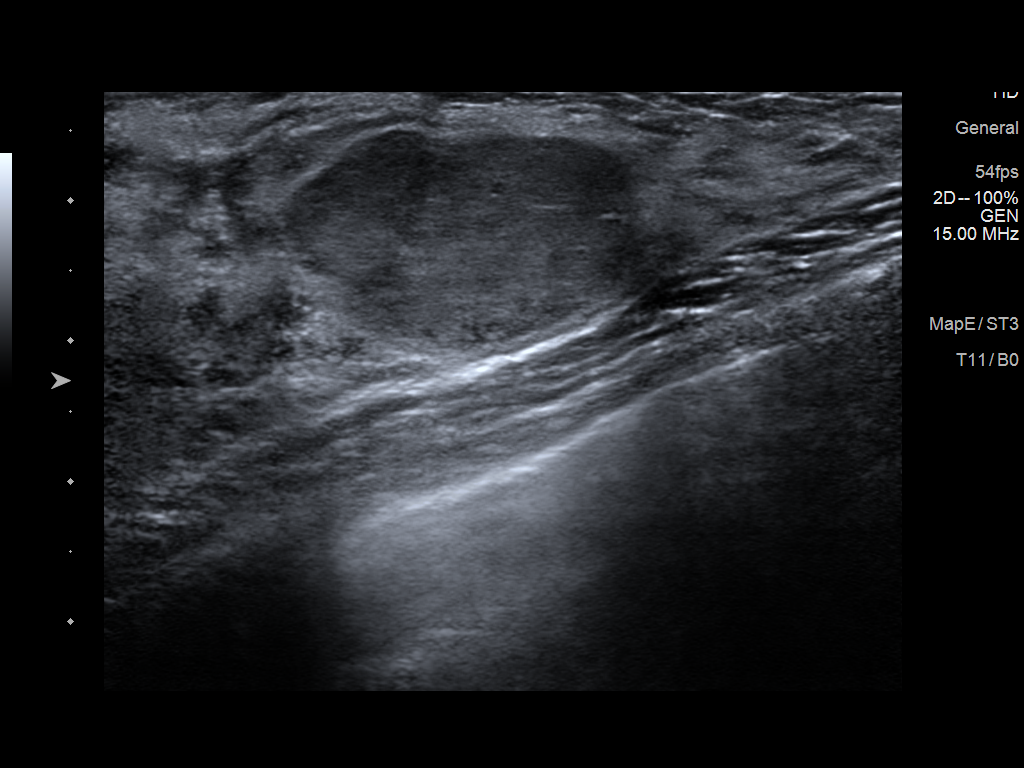
[im 3/6]
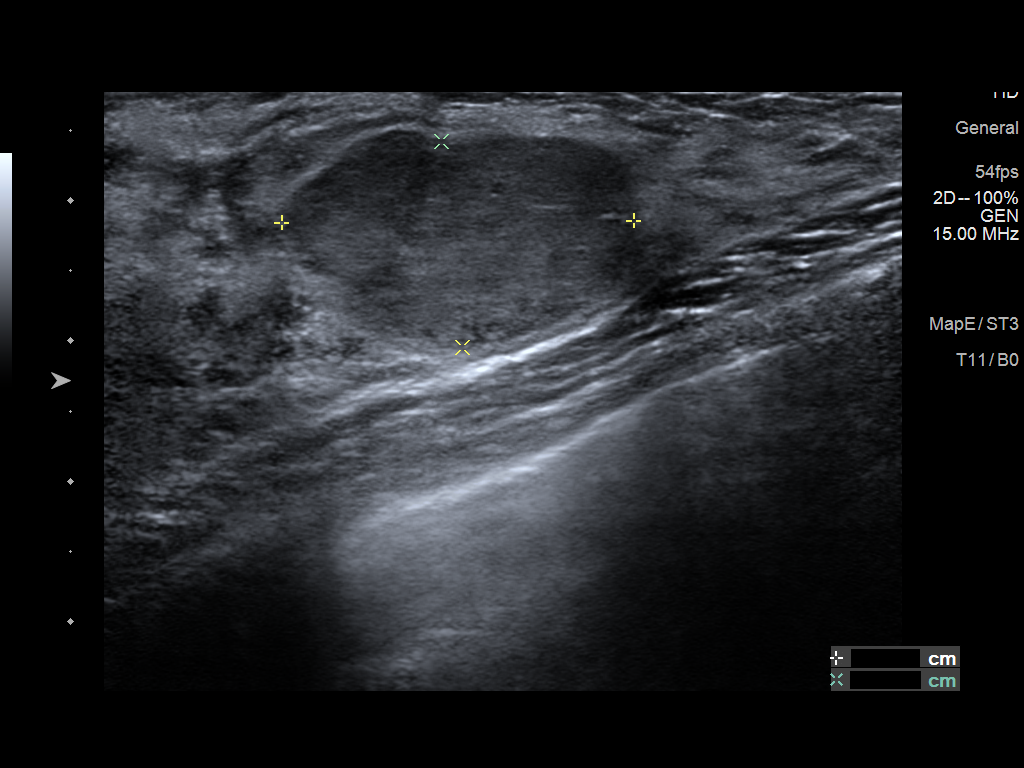
[im 4/6]
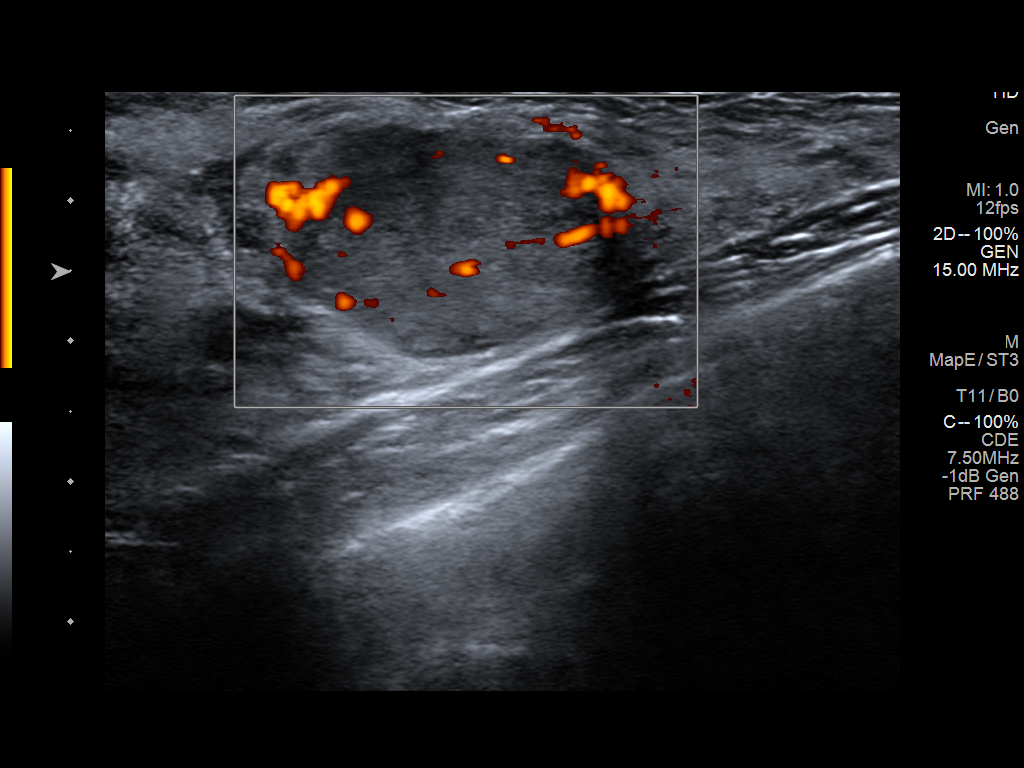
[im 5/6]
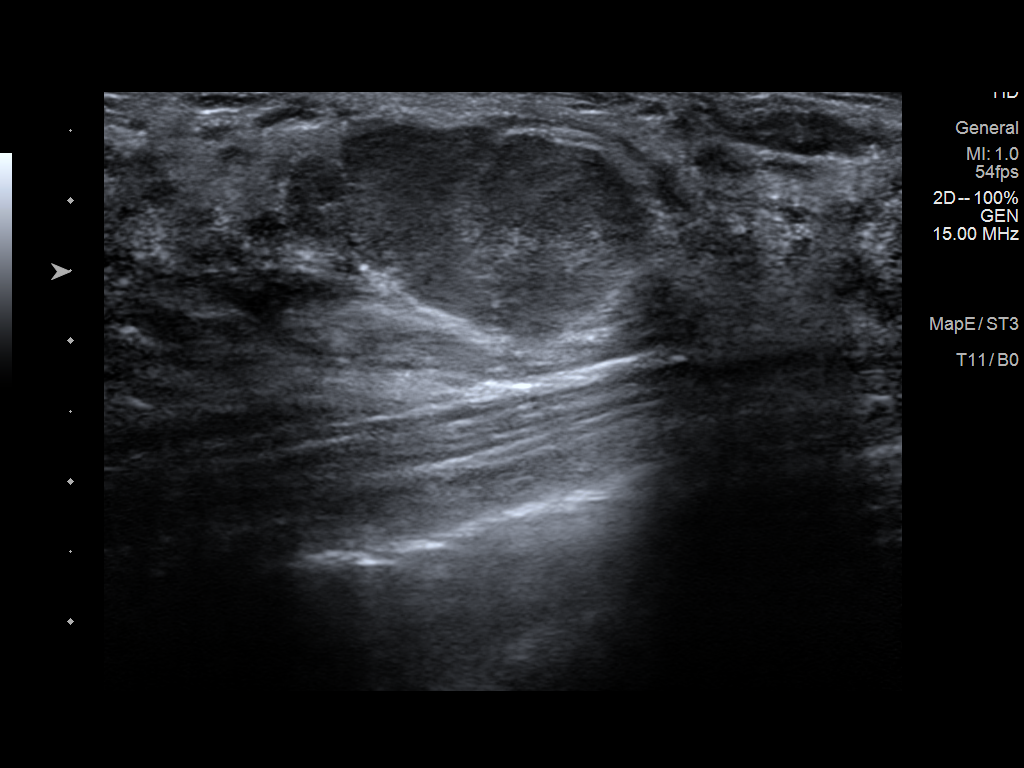
[im 6/6]
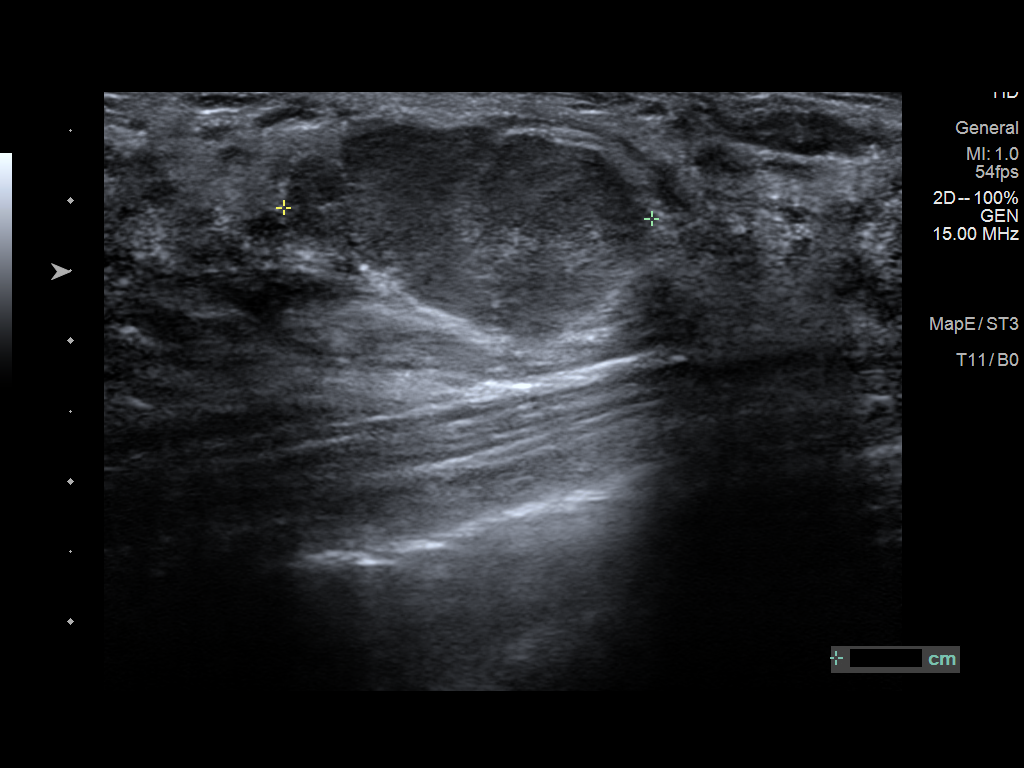

[6 of 6 positions shown; findings below may reference images not displayed]

FINDINGS: On physical exam, a lump is felt in the right breast at 3 o'clock

Targeted ultrasound is performed, showing a mass in the right breast
at 3 o'clock, 3 cm from the nipple measuring 2.5 by 1.5 x 2.6 cm,
likely a fibroadenoma.
IMPRESSION: Probably benign right breast mass.

RECOMMENDATION:
Six-month follow-up ultrasound of the probably benign right breast
mass.

I have discussed the findings and recommendations with the patient.
Results were also provided in writing at the conclusion of the
visit. If applicable, a reminder letter will be sent to the patient
regarding the next appointment.

BI-RADS CATEGORY  3: Probably benign.

## 2021-06-22 IMAGING — US US BREAST*R* LIMITED INC AXILLA
1 series · 5 of 5 positions shown · non-contrast
Comparison: 10/07/2018.

CLINICAL DATA: Follow-up right breast probably benign fibroadenoma.
The patient reports that the palpable mass is smaller now. She
started taking Depo shots in October 2018. Prior to that, she had not
had any birth control since taking birth control pills, the last in
February 2018. No family history of breast cancer.

EXAM:
ULTRASOUND OF THE RIGHT BREAST

[Series 1: us breast*right* limited inc axilla · 0.06mm/px · 5 of 5 slices shown]
[im 1/5]
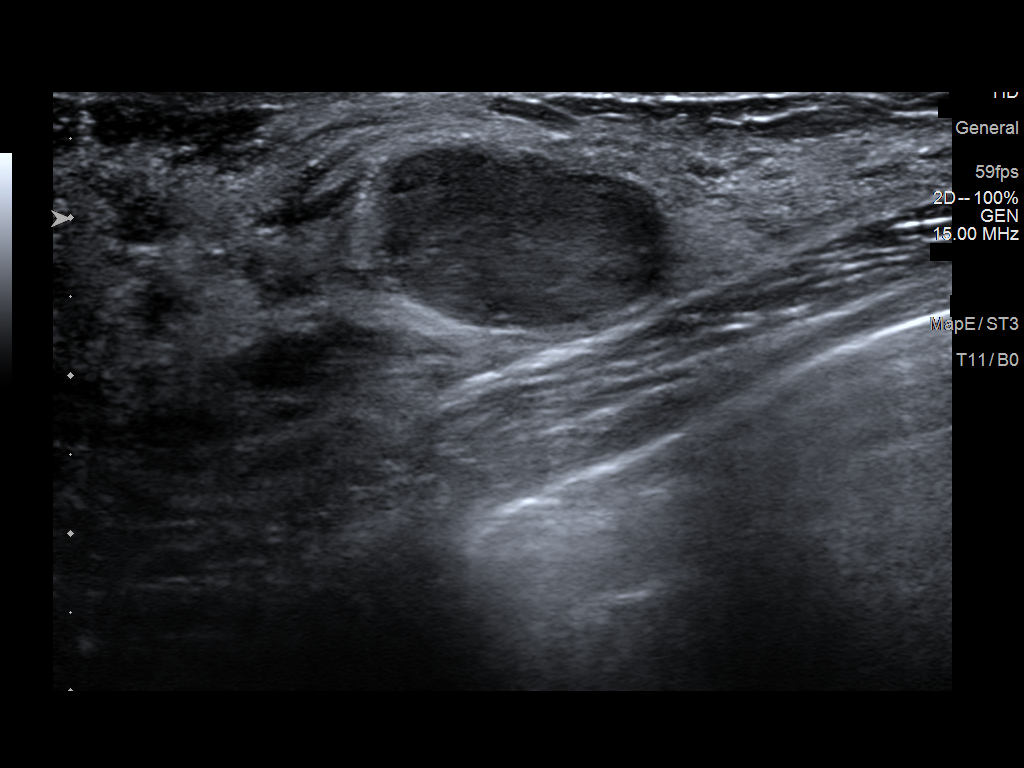
[im 2/5]
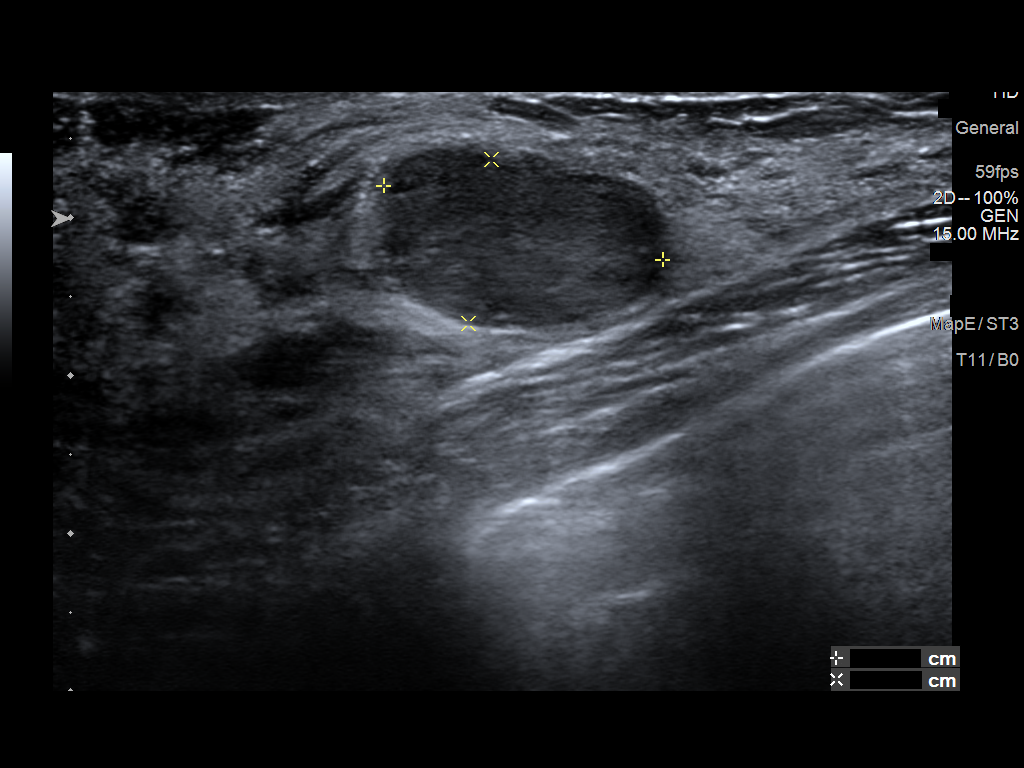
[im 3/5]
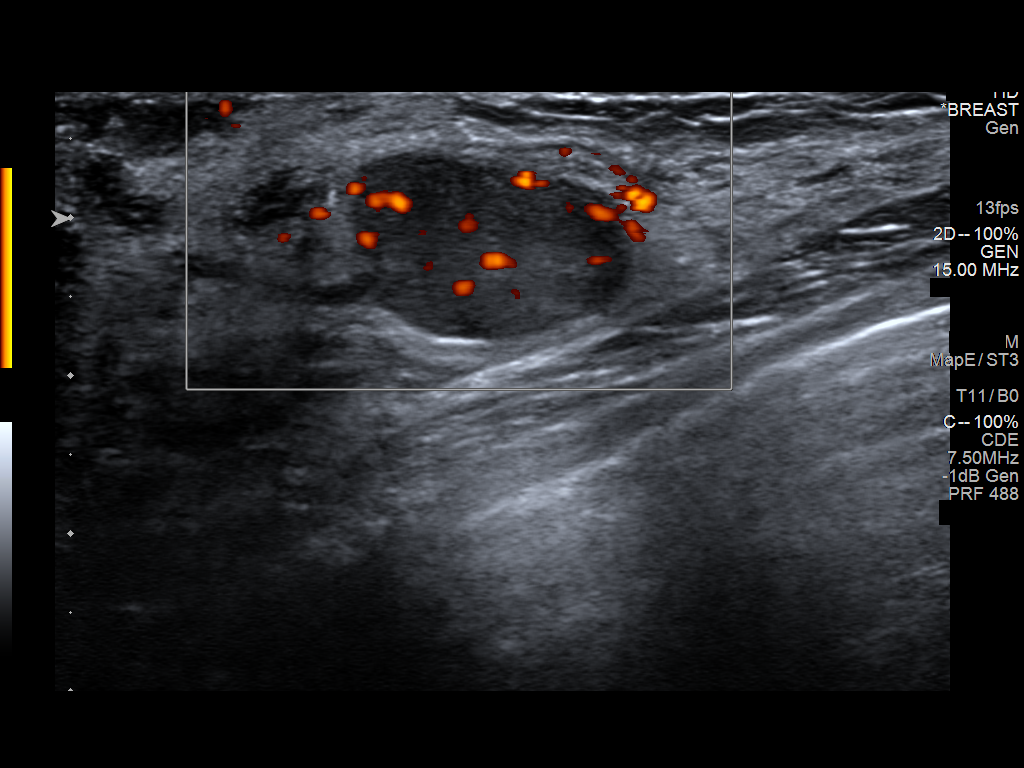
[im 4/5]
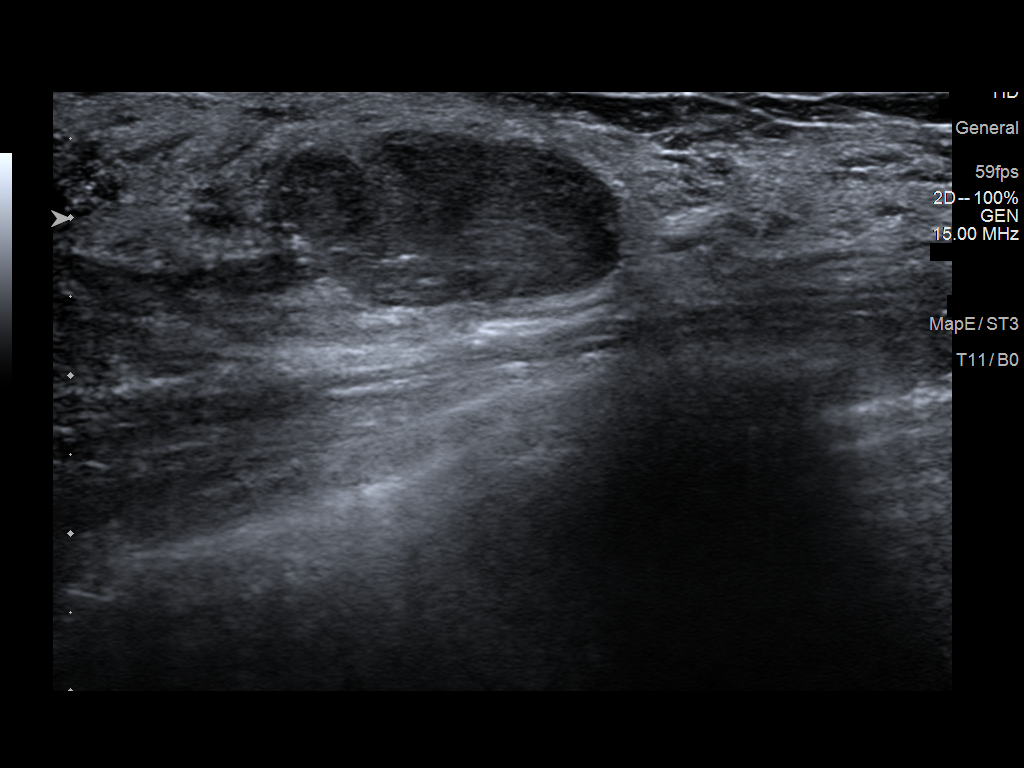
[im 5/5]
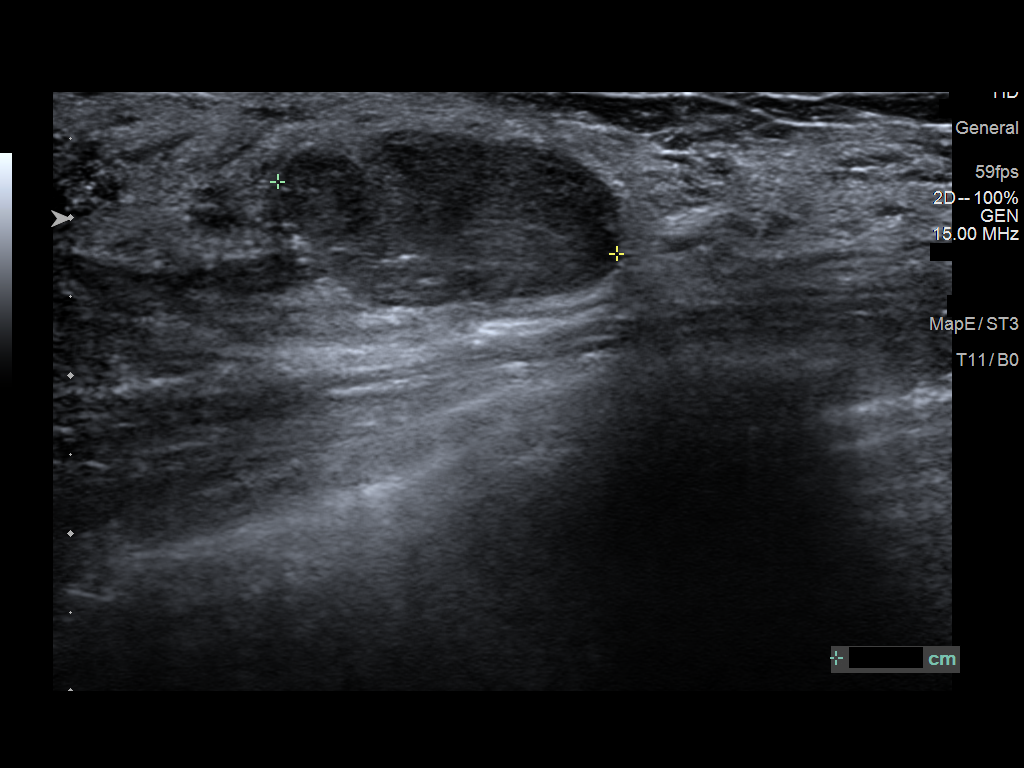

[5 of 5 positions shown; findings below may reference images not displayed]

FINDINGS: On physical exam, the patient has an approximately 1.5 cm oval,
circumscribed, mobile palpable mass 2 o'clock retroareolar right
breast.

Targeted ultrasound is performed, showing a 2.2 x 1.8 x 1.1 cm oval,
circumscribed, mildly hypoechoic mass containing thin internal
septations in the right breast, corresponding to the palpable mass.
This measured 2.6 x 2.5 x 1.5 cm on 10/07/2018.
IMPRESSION: Interval significant decrease in size of the previously demonstrated
palpable probable fibroadenoma in the right breast. The significant
decrease in size is compatible with a benign fibroadenoma. Therefore
this does not need further follow-up. The patient was instructed to
return for a repeat right breast ultrasound if she notices an
increase in size on monthly self breast examination.

RECOMMENDATION:
Annual screening mammography beginning at age 40.

I have discussed the findings and recommendations with the patient.
If applicable, a reminder letter will be sent to the patient
regarding the next appointment.

BI-RADS CATEGORY  2: Benign.

## 2021-12-11 ENCOUNTER — Encounter: Payer: Self-pay | Admitting: *Deleted

## 2022-03-01 ENCOUNTER — Encounter: Payer: Self-pay | Admitting: *Deleted

## 2023-04-19 ENCOUNTER — Ambulatory Visit
Admission: RE | Admit: 2023-04-19 | Discharge: 2023-04-19 | Disposition: A | Discharge status: Home / Self Care | Payer: No Typology Code available for payment source | Source: Ambulatory Visit | Attending: Infectious Diseases | Admitting: Infectious Diseases

## 2023-04-19 ENCOUNTER — Other Ambulatory Visit: Payer: Self-pay | Admitting: Infectious Diseases

## 2023-04-19 DIAGNOSIS — Z111 Encounter for screening for respiratory tuberculosis: Secondary | ICD-10-CM

## 2023-06-24 ENCOUNTER — Ambulatory Visit: Payer: Federal, State, Local not specified - PPO | Admitting: Physician Assistant

## 2023-10-31 ENCOUNTER — Ambulatory Visit: Admitting: Nurse Practitioner

## 2023-10-31 ENCOUNTER — Telehealth: Payer: Self-pay | Admitting: *Deleted

## 2023-10-31 ENCOUNTER — Encounter: Payer: Self-pay | Admitting: Nurse Practitioner

## 2023-10-31 VITALS — BP 108/82 | HR 84 | Resp 16 | Wt 130.0 lb

## 2023-10-31 DIAGNOSIS — N6310 Unspecified lump in the right breast, unspecified quadrant: Secondary | ICD-10-CM

## 2023-10-31 DIAGNOSIS — N644 Mastodynia: Secondary | ICD-10-CM | POA: Diagnosis not present

## 2023-10-31 DIAGNOSIS — N912 Amenorrhea, unspecified: Secondary | ICD-10-CM

## 2023-10-31 LAB — PREGNANCY, URINE: Preg Test, Ur: NEGATIVE

## 2023-10-31 NOTE — Progress Notes (Signed)
   Acute Office Visit  Subjective:    Patient ID: Marisa Snyder, female    DOB: Aug 15, 2000, 23 y.o.   MRN: [id number]   HPI 23 y.o. G0 presents today as new patient for bilateral breast lumps. Patient noticed them yesterday. 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 Last dose of Depo 4 months ago and breasts have been a little more tender lately. Denies nipple discharge, pain, skin changes in right breast but has pain at inner left breast. No family history of breast cancer. Sexually active. Not interested in contraception at this time.   Patient's last menstrual period was 09/20/2023.    Review of Systems  Constitutional: Negative.   Hematological:  Negative for adenopathy.  Right breast: Positive for mass. Negative for nipple discharge, pain, skin changes.  Left breast: Positive for mass. Negative for nipple discharge, pain, skin changes.      Objective:    Physical Exam Constitutional:      Appearance: Normal appearance.  Chest:  Breasts:    Right: Mass present. No swelling, inverted nipple, nipple discharge, skin change or tenderness.     Left: Tenderness present. No swelling, inverted nipple, mass, nipple discharge or skin change.    Lymphadenopathy:     Upper Body:     Right upper body: No supraclavicular or axillary adenopathy.     Left upper body: No supraclavicular or axillary adenopathy.     BP 108/82   Pulse 84   Resp 16   Wt 130 lb (59 kg)   LMP 09/20/2023   BMI 21.63 kg/m  Wt Readings from Last 3 Encounters:  10/31/23 130 lb (59 kg)  07/29/19 112 lb (50.8 kg)  06/25/19 114 lb 11.2 oz (52 kg)       UPT neg  Assessment & Plan:   Problem List Items Addressed This Visit   None Visit Diagnoses       Mass of right breast, unspecified quadrant    -  Primary     Amenorrhea       Relevant Orders   Pregnancy, urine (Completed)     Pain of left breast          Plan: Multiple masses in right breast. Likely fibroadenomas. Tenderness in left breast.  Will send for diagnostic imaging.      Annabella DELENA Shutter DNP, 2:04 PM 10/31/2023

## 2023-10-31 NOTE — Telephone Encounter (Signed)
-----   Message from Annabella DELENA Shutter sent at 10/31/2023  2:04 PM EDT ----- Regarding: Diagnostic imaging Please send orders for diagnostic imaging for masses in right breast, pain in left breast.

## 2023-11-01 NOTE — Telephone Encounter (Signed)
 Spoke with Tatyana at [facility]. Patient scheduled for bilateral breast US  on 11/12/23 at 1030.   Spoke with patient, advised as seen above. Patient verbalizes understanding and is agreeable.   Encounter closed.

## 2023-11-12 ENCOUNTER — Telehealth: Payer: Self-pay

## 2023-11-12 ENCOUNTER — Ambulatory Visit
Admission: RE | Admit: 2023-11-12 | Discharge: 2023-11-12 | Disposition: A | Discharge status: Home / Self Care | Payer: Self-pay | Source: Ambulatory Visit | Attending: Nurse Practitioner | Admitting: Nurse Practitioner

## 2023-11-12 DIAGNOSIS — N6310 Unspecified lump in the right breast, unspecified quadrant: Secondary | ICD-10-CM

## 2023-11-12 DIAGNOSIS — N644 Mastodynia: Secondary | ICD-10-CM

## 2023-11-12 NOTE — Telephone Encounter (Signed)
 [facility] called & stated that they needed a bilateral u/s order for patient. She has an appt tomorrow. I called the patient & she stated she decided not to go to the breast center since she could get in with [facility] sooner. Order faxed to [facility].

## 2023-11-20 ENCOUNTER — Ambulatory Visit
Admission: RE | Admit: 2023-11-20 | Discharge: 2023-11-20 | Disposition: A | Discharge status: Home / Self Care | Source: Ambulatory Visit | Attending: Nurse Practitioner | Admitting: Nurse Practitioner

## 2023-11-20 ENCOUNTER — Ambulatory Visit: Payer: Self-pay | Admitting: Nurse Practitioner

## 2023-12-24 ENCOUNTER — Ambulatory Visit: Admitting: Nurse Practitioner

## 2024-01-02 ENCOUNTER — Ambulatory Visit: Admitting: Nurse Practitioner

## 2024-01-08 ENCOUNTER — Ambulatory Visit: Admitting: Nurse Practitioner

## 2024-02-27 ENCOUNTER — Emergency Department (HOSPITAL_COMMUNITY)
Admission: EM | Admit: 2024-02-27 | Discharge: 2024-02-27 | Discharge status: Against Medical Advice | Attending: Emergency Medicine | Admitting: Emergency Medicine

## 2024-02-27 ENCOUNTER — Other Ambulatory Visit: Payer: Self-pay

## 2024-02-27 ENCOUNTER — Encounter (HOSPITAL_COMMUNITY): Payer: Self-pay

## 2024-02-27 ENCOUNTER — Emergency Department (HOSPITAL_COMMUNITY)
Admission: EM | Admit: 2024-02-27 | Discharge: 2024-02-27 | Disposition: A | Discharge status: Home / Self Care | Attending: Emergency Medicine | Admitting: Emergency Medicine

## 2024-02-27 DIAGNOSIS — M542 Cervicalgia: Secondary | ICD-10-CM | POA: Insufficient documentation

## 2024-02-27 DIAGNOSIS — Z5321 Procedure and treatment not carried out due to patient leaving prior to being seen by health care provider: Secondary | ICD-10-CM | POA: Insufficient documentation

## 2024-02-27 LAB — CBC
HCT: 37.2 % (ref 36.0–46.0)
Hemoglobin: 12.3 g/dL (ref 12.0–15.0)
MCH: 27 pg (ref 26.0–34.0)
MCHC: 33.1 g/dL (ref 30.0–36.0)
MCV: 81.6 fL (ref 80.0–100.0)
Platelets: 258 K/uL (ref 150–400)
RBC: 4.56 MIL/uL (ref 3.87–5.11)
RDW: 14.1 % (ref 11.5–15.5)
WBC: 9.7 K/uL (ref 4.0–10.5)
nRBC: 0 % (ref 0.0–0.2)

## 2024-02-27 LAB — BASIC METABOLIC PANEL WITH GFR
Anion gap: 12 (ref 5–15)
BUN: 6 mg/dL (ref 6–20)
CO2: 22 mmol/L (ref 22–32)
Calcium: 9.5 mg/dL (ref 8.9–10.3)
Chloride: 102 mmol/L (ref 98–111)
Creatinine, Ser: 0.56 mg/dL (ref 0.44–1.00)
GFR, Estimated: >60 mL/min (ref >60)
Glucose, Bld: 81 mg/dL (ref 70–99)
Potassium: 3.6 mmol/L (ref 3.5–5.1)
Sodium: 136 mmol/L (ref 135–145)

## 2024-02-27 LAB — HCG, SERUM, QUALITATIVE: Preg, Serum: NEGATIVE

## 2024-02-27 MED ORDER — CYCLOBENZAPRINE HCL 10 MG PO TABS: 10.0000 mg | ORAL_TABLET | Freq: Two times a day (BID) | ORAL | 0 refills | Status: AC | PRN

## 2024-02-27 MED ORDER — KETOROLAC TROMETHAMINE 30 MG/ML IJ SOLN
15.0000 mg | Freq: Once | INTRAMUSCULAR | Status: AC
  Administered 2024-02-27: 15 mg via INTRAVENOUS
  Filled 2024-02-27: qty 1

## 2024-02-27 MED ORDER — CYCLOBENZAPRINE HCL 10 MG PO TABS
5.0000 mg | ORAL_TABLET | Freq: Once | ORAL | Status: AC
  Administered 2024-02-27: 5 mg via ORAL
  Filled 2024-02-27: qty 1

## 2024-02-27 MED ORDER — OXYCODONE-ACETAMINOPHEN 5-325 MG PO TABS
1.0000 | ORAL_TABLET | ORAL | Status: DC | PRN
  Administered 2024-02-27: 1 via ORAL
  Filled 2024-02-27: qty 1

## 2024-02-27 NOTE — ED Provider Notes (Signed)
 [facility]PART[facility] AT [facility] Provider Note   CSN: [id number] Arrival date & time: 02/27/24  1003     Patient presents with: Torticollis  HPI Marisa Snyder is a 23 y.o. female presenting for neck stiffness.  Started on Monday when she woke from sleep that morning.  Pain is in the posterior neck and radiates to the right and left shoulders.  It is worse with movement.  She reports also that today it seemed to be worse.  Denies any trauma.  Denies fever or sick contacts.  States she has been taking Tylenol which has provided minimal relief.   HPI     Prior to Admission medications  Medication Sig Start Date End Date Taking? Authorizing Provider  valACYclovir (VALTREX) 1000 MG tablet SMARTSIG:1 Tablet(s) By Mouth Every 12 Hours    [provider]    Allergies: Patient has no known allergies.    Review of Systems See HPI  Updated Vital Signs BP 117/84 (BP Location: Right Arm)   Pulse 71   Temp 98.4 F (36.9 C) (Oral)   Resp 16   LMP 02/05/2024 (Approximate)   SpO2 100%   Physical Exam Vitals and nursing note reviewed.  HENT:     Head: Normocephalic and atraumatic.     Mouth/Throat:     Mouth: Mucous membranes are moist.  Eyes:     General:        Right eye: No discharge.        Left eye: No discharge.     Conjunctiva/sclera: Conjunctivae normal.  Neck:     Thyroid: No thyroid mass, thyromegaly or thyroid tenderness.     Vascular: Normal carotid pulses. No carotid bruit or JVD.     Trachea: Trachea and phonation normal.     Comments: Tenderness with rotation of the neck in all directions Cardiovascular:     Rate and Rhythm: Normal rate and regular rhythm.     Pulses: Normal pulses.     Heart sounds: Normal heart sounds.  Pulmonary:     Effort: Pulmonary effort is normal.     Breath sounds: Normal breath sounds.  Abdominal:     General: Abdomen is flat.     Palpations: Abdomen is soft.  Musculoskeletal:     Cervical back:  Full passive range of motion without pain, normal range of motion and neck supple. No edema, erythema, rigidity or torticollis. Spinous process tenderness and muscular tenderness present.  Lymphadenopathy:     Cervical: No cervical adenopathy.  Skin:    General: Skin is warm and dry.  Neurological:     General: No focal deficit present.  Psychiatric:        Mood and Affect: Mood normal.     (all labs ordered are listed, but only abnormal results are displayed) Labs Reviewed  CBC  BASIC METABOLIC PANEL WITH GFR  HCG, SERUM, QUALITATIVE    EKG: None  Radiology: No results found.   Procedures   Medications Ordered in the ED  ketorolac  (TORADOL ) 30 MG/ML injection 15 mg (15 mg Intravenous Given 02/27/24 1422)  cyclobenzaprine (FLEXERIL) tablet 5 mg (5 mg Oral Given 02/27/24 1422)                                    Medical Decision Making Amount and/or Complexity of Data Reviewed Labs: ordered.  Risk Prescription drug management.   23 year old well-appearing female presenting for  neck stiffness.  Exam notable for generalized tenderness about the posterior neck with movement and palpation but there is no evidence of trauma or concern for infection on exam.  DDx includes meningitis, spinal epidural abscess, vascular dissection, cervical mass, other.  Treated with Toradol  and Flexeril.  On reassessment she stated that her symptoms had resolved.  Labs were unremarkable and she is afebrile.  Suspicion for dangerous pathology is low given how well she appears with reassuring vitals, relatively normal range of motion of her neck and resolution of her symptoms after Toradol  and Flexeril.  Advised continued supportive treatment for what is likely MSK pain with NSAIDs and I did send muscle relaxers to her pharmacy as well.  Advised her to follow-up with her primary care provider.  Discussed return precautions.  Discharged.     Final diagnoses:  Neck pain    ED Discharge Orders      None          Lang Norleen POUR, PA-C 02/27/24 1503    Pfeiffer, Marcy, MD 02/27/24 1550

## 2024-02-27 NOTE — Discharge Instructions (Addendum)
 Evaluation today was overall reassuring.  Suspect this is muscular pain.  As we discussed recommend ibuprofen and ice for the next 3 to 4 days and you can also take Tylenol.  I also sent muscle relaxers to your pharmacy as well but they will make you drowsy so would recommend taking them at night.  Please follow-up with your PCP.  If you develop visual changes, weakness or numbness in your extremities, neck rigidity, fever or any other concerning symptom please return to the ED for further evaluation.

## 2024-02-27 NOTE — ED Triage Notes (Signed)
 Presents with neck pain and stiffness that started on Monday when she woke up. Pain radiates from her neck to her upper back. Pt also reports that it hurts when she swallows. Denies having a fever, hasn't been around anyone sick to her knowledge. Was seen at [facility] this morning with minimal relief, and since then hasn't been able to sleep. Pt denies injury.

## 2024-02-27 NOTE — ED Triage Notes (Addendum)
 Pt c.o neck stiffness/pain since Monday, started on the right side of her neck but has now progressed to the back and left side. Pt taking tylenol and using hot/cold packs without relief. Pt denies injury. Denies numbness in extremities. Pt reports pain when swallowing.

## 2024-02-28 DIAGNOSIS — E871 Hypo-osmolality and hyponatremia: Secondary | ICD-10-CM | POA: Diagnosis not present

## 2024-02-28 DIAGNOSIS — M542 Cervicalgia: Secondary | ICD-10-CM | POA: Diagnosis not present

## 2024-02-28 DIAGNOSIS — E876 Hypokalemia: Secondary | ICD-10-CM | POA: Diagnosis not present

## 2024-02-28 DIAGNOSIS — R519 Headache, unspecified: Secondary | ICD-10-CM | POA: Diagnosis not present

## 2024-02-28 DIAGNOSIS — D649 Anemia, unspecified: Secondary | ICD-10-CM | POA: Diagnosis not present
